# Patient Record
Sex: Female | Born: 1970
Health system: Southern US, Community
[De-identification: ages and names within clinical notes are randomized; demographics above are authoritative.]

## PROBLEM LIST (undated history)

## (undated) DIAGNOSIS — E785 Hyperlipidemia, unspecified: Secondary | ICD-10-CM

## (undated) HISTORY — DX: Hyperlipidemia, unspecified: E78.5

---

## 2000-01-28 ENCOUNTER — Other Ambulatory Visit: Admission: RE | Admit: 2000-01-28 | Discharge: 2000-01-28 | Payer: Self-pay | Admitting: Family Medicine

## 2005-11-25 ENCOUNTER — Other Ambulatory Visit: Admission: RE | Admit: 2005-11-25 | Discharge: 2005-11-25 | Payer: Self-pay | Admitting: *Deleted

## 2007-03-28 ENCOUNTER — Other Ambulatory Visit: Admission: RE | Admit: 2007-03-28 | Discharge: 2007-03-28 | Payer: Self-pay | Admitting: Family Medicine

## 2008-05-26 ENCOUNTER — Other Ambulatory Visit: Admission: RE | Admit: 2008-05-26 | Discharge: 2008-05-26 | Payer: Self-pay | Admitting: *Deleted

## 2008-05-26 ENCOUNTER — Other Ambulatory Visit: Admission: RE | Admit: 2008-05-26 | Discharge: 2008-05-26 | Payer: Self-pay | Admitting: Family Medicine

## 2010-06-24 ENCOUNTER — Other Ambulatory Visit (HOSPITAL_COMMUNITY)
Admission: RE | Admit: 2010-06-24 | Discharge: 2010-06-24 | Disposition: A | Discharge status: Home / Self Care | Payer: 59 | Source: Ambulatory Visit | Attending: Family Medicine | Admitting: Family Medicine

## 2010-06-24 ENCOUNTER — Other Ambulatory Visit: Payer: Self-pay | Admitting: Family Medicine

## 2010-06-24 DIAGNOSIS — Z124 Encounter for screening for malignant neoplasm of cervix: Secondary | ICD-10-CM | POA: Insufficient documentation

## 2011-07-06 ENCOUNTER — Other Ambulatory Visit (HOSPITAL_COMMUNITY)
Admission: RE | Admit: 2011-07-06 | Discharge: 2011-07-06 | Disposition: A | Discharge status: Home / Self Care | Payer: 59 | Source: Ambulatory Visit | Attending: Family Medicine | Admitting: Family Medicine

## 2011-07-06 ENCOUNTER — Other Ambulatory Visit: Payer: Self-pay | Admitting: Family Medicine

## 2011-07-06 DIAGNOSIS — Z124 Encounter for screening for malignant neoplasm of cervix: Secondary | ICD-10-CM | POA: Insufficient documentation

## 2011-07-07 ENCOUNTER — Other Ambulatory Visit: Payer: Self-pay | Admitting: Family Medicine

## 2011-07-07 DIAGNOSIS — N852 Hypertrophy of uterus: Secondary | ICD-10-CM

## 2011-07-27 ENCOUNTER — Ambulatory Visit
Admission: RE | Admit: 2011-07-27 | Discharge: 2011-07-27 | Disposition: A | Discharge status: Home / Self Care | Payer: 59 | Source: Ambulatory Visit | Attending: Family Medicine | Admitting: Family Medicine

## 2011-07-27 ENCOUNTER — Other Ambulatory Visit: Payer: 59

## 2011-07-27 DIAGNOSIS — N852 Hypertrophy of uterus: Secondary | ICD-10-CM

## 2011-08-22 ENCOUNTER — Other Ambulatory Visit (HOSPITAL_COMMUNITY)
Admission: RE | Admit: 2011-08-22 | Discharge: 2011-08-22 | Disposition: A | Discharge status: Home / Self Care | Payer: 59 | Source: Ambulatory Visit | Attending: Obstetrics and Gynecology | Admitting: Obstetrics and Gynecology

## 2011-08-22 ENCOUNTER — Other Ambulatory Visit: Payer: Self-pay | Admitting: Obstetrics and Gynecology

## 2011-08-22 DIAGNOSIS — Z1151 Encounter for screening for human papillomavirus (HPV): Secondary | ICD-10-CM | POA: Insufficient documentation

## 2011-10-19 ENCOUNTER — Other Ambulatory Visit: Payer: Self-pay | Admitting: Endocrinology

## 2011-10-19 DIAGNOSIS — E229 Hyperfunction of pituitary gland, unspecified: Secondary | ICD-10-CM

## 2011-11-07 ENCOUNTER — Other Ambulatory Visit: Payer: 59

## 2014-12-29 ENCOUNTER — Other Ambulatory Visit (HOSPITAL_COMMUNITY)
Admission: RE | Admit: 2014-12-29 | Discharge: 2014-12-29 | Disposition: A | Discharge status: Home / Self Care | Payer: 59 | Source: Ambulatory Visit | Attending: Obstetrics and Gynecology | Admitting: Obstetrics and Gynecology

## 2014-12-29 ENCOUNTER — Other Ambulatory Visit: Payer: Self-pay | Admitting: Obstetrics and Gynecology

## 2014-12-29 DIAGNOSIS — Z1151 Encounter for screening for human papillomavirus (HPV): Secondary | ICD-10-CM | POA: Insufficient documentation

## 2014-12-29 DIAGNOSIS — Z01419 Encounter for gynecological examination (general) (routine) without abnormal findings: Secondary | ICD-10-CM | POA: Insufficient documentation

## 2014-12-31 LAB — CYTOLOGY - PAP

## 2015-01-12 ENCOUNTER — Ambulatory Visit (INDEPENDENT_AMBULATORY_CARE_PROVIDER_SITE_OTHER): Payer: 59 | Admitting: Endocrinology

## 2015-01-12 ENCOUNTER — Encounter: Payer: Self-pay | Admitting: Endocrinology

## 2015-01-12 VITALS — BP 130/86 | HR 69 | Temp 98.3°F | Resp 14 | Ht 68.4 in | Wt 156.6 lb

## 2015-01-12 DIAGNOSIS — R635 Abnormal weight gain: Secondary | ICD-10-CM | POA: Diagnosis not present

## 2015-01-12 DIAGNOSIS — E221 Hyperprolactinemia: Secondary | ICD-10-CM

## 2015-01-12 LAB — BASIC METABOLIC PANEL
BUN: 13 mg/dL (ref 6–23)
CALCIUM: 9.8 mg/dL (ref 8.4–10.5)
CHLORIDE: 103 meq/L (ref 96–112)
CO2: 31 meq/L (ref 19–32)
CREATININE: 0.7 mg/dL (ref 0.40–1.20)
GFR: 116.97 mL/min (ref >60.00)
GLUCOSE: 94 mg/dL (ref 70–99)
Potassium: 3.9 mEq/L (ref 3.5–5.1)
Sodium: 139 mEq/L (ref 135–145)

## 2015-01-12 LAB — T4, FREE: Free T4: 0.57 ng/dL — ABNORMAL LOW (ref 0.60–1.60)

## 2015-01-12 NOTE — Progress Notes (Signed)
Patient ID: Michele Ortiz, female   DOB: [birth year], 44 y.o.   MRN: [id number]           Chief complaint: No menstrual cycles since 2013   History of Present Illness:  The patient had been seen about 3 years ago when she was having progressive decline in her menstrual bleeding and eventually stopped having menstrual cycles.  At that time her prolactin level was 109 She did not follow-up or get her MRI scan because of lack of insurance  When she saw her new gynecologist in 11/2014 she was still not having any menstrual cycles Does not complain of any milky discharge from the breasts. She has not had any previous pregnancies except when she was around 36 and this was terminated Also does not have any hot flashes or headaches Recent prolactin level was 72.5  She is now referred here for further management   Past Medical History  Diagnosis Date  . Hyperlipidemia     LDL 176 in 10/16, started atorvastatin    History reviewed. No pertinent past surgical history.  Family History  Problem Relation Age of Onset  . Hyperlipidemia Mother   . Diabetes Maternal Grandmother     Social History:  reports that she has never smoked. She has never used smokeless tobacco. Her alcohol and drug histories are not on file.  Allergies: No Known Allergies    Medication List       This list is accurate as of: 01/12/15  9:18 PM.  Always use your most recent med list.               atorvastatin 20 MG tablet  Commonly known as:  LIPITOR  Take 20 mg by mouth daily.     Vitamin D (Ergocalciferol) 50000 UNITS Caps capsule  Commonly known as:  DRISDOL  TAKE ONE CAPSULE BY MOUTH WEEKLY FOR 8 WEEKS        LABS:  Office Visit on 01/12/2015  Component Date Value Ref Range Status  . Sodium 01/12/2015 139  135 - 145 mEq/L Final  . Potassium 01/12/2015 3.9  3.5 - 5.1 mEq/L Final  . Chloride 01/12/2015 103  96 - 112 mEq/L Final  . CO2 01/12/2015 31  19 - 32 mEq/L Final  . Glucose, Bld  01/12/2015 94  70 - 99 mg/dL Final  . BUN 01/12/2015 13  6 - 23 mg/dL Final  . Creatinine, Ser 01/12/2015 0.70  0.40 - 1.20 mg/dL Final  . Calcium 01/12/2015 9.8  8.4 - 10.5 mg/dL Final  . GFR 01/12/2015 116.97  >60.00 mL/min Final  . Free T4 01/12/2015 0.57* 0.60 - 1.60 ng/dL Final     REVIEW OF SYSTEMS:        Review of Systems  Constitutional: Positive for weight gain. Negative for malaise.       Has gained 5-10 pounds over the last couple of years  HENT: Negative for headaches.   Eyes: Negative for blurred vision.       Peripheral vision is reported normal  Gastrointestinal: Negative for nausea.  Endocrine: Positive for menstrual changes and abnormal weight gain. Negative for fatigue, cold intolerance, heat intolerance and breast discharge.       Amenorrhea as discussed in history of present illness  Genitourinary: Negative for nocturia.  Neurological: Negative for weakness.  Psychiatric/Behavioral: Negative for insomnia.   ?  Diabetes: Her A1c was 6.0 in 10/16 from PCP, normal <5.6    PHYSICAL EXAM:  BP 130/86 mmHg  Pulse  69  Temp(Src) 98.3 F (36.8 C)  Resp 14  Ht 5' 8.4" (1.737 m)  Wt 156 lb 9.6 oz (71.033 kg)  BMI 23.54 kg/m2  SpO2 99%  GENERAL: Averagely built and nourished  No pallor, clubbing, lymphadenopathy or edema.   Skin:  no rash or pigmentation.  EYES:  Externally normal.  Fundii:  normal discs and vessels. Visual fields normal by confrontation  ENT: Oral mucosa and tongue normal.  THYROID:  Not palpable.  HEART:  Normal  S1 and S2; no murmur or click.  CHEST:  Normal shape.  Lungs: Vescicular breath sounds heard equally.  No crepitations/ wheeze.  ABDOMEN:  No distention.  Liver and spleen not palpable.  No other mass or tenderness.  NEUROLOGICAL: .Reflexes are bilaterally normal at biceps  JOINTS:  Normal.  ASSESSMENT:   Amenorrhea and hyperprolactinemia, likely to be from a prolactinoma This needs to be confirmed with MRI of  pituitary gland Discussed pituitary gland function and general information on prolactinoma discussed and bring card given Discussed potential treatments with medications such as cabergoline and duration of treatment being indeterminate at this time   PLAN:   MRI of pituitary to be scheduled  To discuss treatment plans once results are available Most likely will need  follow-up in about 2 months with repeat prolactin after treatment is started  [facility] 01/12/2015, 9:18 PM    addendum: free T4 is low, will add TSH also,  Most likely needs  To start levothyroxine

## 2015-01-13 ENCOUNTER — Other Ambulatory Visit (INDEPENDENT_AMBULATORY_CARE_PROVIDER_SITE_OTHER): Payer: 59

## 2015-01-13 DIAGNOSIS — I519 Heart disease, unspecified: Principal | ICD-10-CM

## 2015-01-13 DIAGNOSIS — E039 Hypothyroidism, unspecified: Secondary | ICD-10-CM

## 2015-01-13 DIAGNOSIS — E221 Hyperprolactinemia: Secondary | ICD-10-CM | POA: Insufficient documentation

## 2015-01-13 LAB — TSH: TSH: 1.16 u[IU]/mL (ref 0.35–4.50)

## 2015-01-27 ENCOUNTER — Telehealth: Payer: Self-pay | Admitting: Endocrinology

## 2015-01-27 NOTE — Telephone Encounter (Signed)
Pt's insurance will not cover [facility], please refer to [facility] or [facility]

## 2015-01-27 NOTE — Telephone Encounter (Signed)
Patient stated that she no longer needed the referral for imaging scan.

## 2015-01-27 NOTE — Telephone Encounter (Signed)
Please explain what as needed

## 2015-01-27 NOTE — Telephone Encounter (Signed)
FYI  Please see below

## 2015-01-28 NOTE — Telephone Encounter (Signed)
Nothing is needed, she was just advising you that she does not need the referral.

## 2015-01-30 ENCOUNTER — Other Ambulatory Visit: Payer: 59

## 2015-01-30 ENCOUNTER — Ambulatory Visit
Admission: RE | Admit: 2015-01-30 | Discharge: 2015-01-30 | Disposition: A | Discharge status: Home / Self Care | Payer: 59 | Source: Ambulatory Visit | Attending: Endocrinology | Admitting: Endocrinology

## 2015-01-30 DIAGNOSIS — E221 Hyperprolactinemia: Secondary | ICD-10-CM

## 2015-01-30 MED ORDER — GADOBENATE DIMEGLUMINE 529 MG/ML IV SOLN
8.0000 mL | Freq: Once | INTRAVENOUS | Status: AC | PRN
  Administered 2015-01-30: 8 mL via INTRAVENOUS

## 2015-02-02 ENCOUNTER — Other Ambulatory Visit: Payer: Self-pay | Admitting: *Deleted

## 2015-02-02 DIAGNOSIS — D352 Benign neoplasm of pituitary gland: Secondary | ICD-10-CM

## 2015-02-02 MED ORDER — CABERGOLINE 0.5 MG PO TABS: 0.2500 mg | ORAL_TABLET | ORAL | Status: DC

## 2015-03-25 ENCOUNTER — Other Ambulatory Visit (INDEPENDENT_AMBULATORY_CARE_PROVIDER_SITE_OTHER): Payer: 59

## 2015-03-25 DIAGNOSIS — D352 Benign neoplasm of pituitary gland: Secondary | ICD-10-CM

## 2015-03-25 LAB — T4, FREE: FREE T4: 0.86 ng/dL (ref 0.60–1.60)

## 2015-03-26 LAB — PROLACTIN: PROLACTIN: 4.7 ng/mL — AB (ref 4.8–23.3)

## 2015-03-30 ENCOUNTER — Ambulatory Visit (INDEPENDENT_AMBULATORY_CARE_PROVIDER_SITE_OTHER): Payer: 59 | Admitting: Endocrinology

## 2015-03-30 ENCOUNTER — Encounter: Payer: Self-pay | Admitting: Endocrinology

## 2015-03-30 VITALS — BP 116/72 | HR 69 | Temp 98.1°F | Resp 14 | Ht 68.0 in | Wt 153.8 lb

## 2015-03-30 DIAGNOSIS — E221 Hyperprolactinemia: Secondary | ICD-10-CM

## 2015-03-30 NOTE — Progress Notes (Signed)
Patient ID: Michele Ortiz, female   DOB: 1970/05/13, 45 y.o.   MRN: [id number]           Chief complaint:  Follow-up of her prolactinoma  History of Present Illness:  The patient had been seen about 3 years ago when she was having progressive decline in her menstrual bleeding and eventually stopped having menstrual cycles.  At that time her prolactin level was 109 She did not follow-up or get her MRI scan because of lack of insurance When she saw her new gynecologist in 11/2014 she was still not having any menstrual cycles , no history of any milky discharge from the breasts. No headaches She has not had any previous pregnancies except when she was around 20 and this was terminated    Baseline prolactin level was 72.5  MRI showed potential 7 mm microadenoma on the right   She was started on CABERGOLINE 0.5 mg, half tablet twice a week in 12/ 16 With this she has had regular menstrual cycles starting late December   She otherwise feels well also  Lab on 03/25/2015  Component Date Value Ref Range Status  . Prolactin 03/25/2015 4.7* 4.8 - 23.3 ng/mL Final  . Free T4 03/25/2015 0.86  0.60 - 1.60 ng/dL Final        Past Medical History  Diagnosis Date  . Hyperlipidemia     LDL 176 in 10/16, started atorvastatin    No past surgical history on file.  Family History  Problem Relation Age of Onset  . Hyperlipidemia Mother   . Diabetes Maternal Grandmother     Social History:  reports that she has never smoked. She has never used smokeless tobacco. Her alcohol and drug histories are not on file.  Allergies: No Known Allergies    Medication List       This list is accurate as of: 03/30/15  9:20 AM.  Always use your most recent med list.               atorvastatin 20 MG tablet  Commonly known as:  LIPITOR  Take 20 mg by mouth daily.     cabergoline 0.5 MG tablet  Commonly known as:  DOSTINEX  Take 0.5 tablets (0.25 mg total) by mouth 2 (two) times a week.     Vitamin D (Ergocalciferol) 50000 units Caps capsule  Commonly known as:  DRISDOL  Reported on 03/30/2015        LABS:  Lab on 03/25/2015  Component Date Value Ref Range Status  . Prolactin 03/25/2015 4.7* 4.8 - 23.3 ng/mL Final  . Free T4 03/25/2015 0.86  0.60 - 1.60 ng/dL Final       Review of Systems  ?  Diabetes: Her A1c was 6.0 in 10/16 from PCP, normal <5.6    PHYSICAL EXAM:  BP 116/72 mmHg  Pulse 69  Temp(Src) 98.1 F (36.7 C)  Resp 14  Ht 5\' 8"  (1.727 m)  Wt 153 lb 12.8 oz (69.763 kg)  BMI 23.39 kg/m2  SpO2 98%   ASSESSMENT:   Amenorrhea and hyperprolactinemia  from a 7 mm prolactinoma She is back to having menstrual cycles regularly now with 0.25 mg cabergoline twice a week Prolactin level is now just below normal indicating excellent response  Also her free T4 is back to normal without any supplementation possibly indicating some pressure effect on thyrotropin     PLAN:    Trial of cabergoline once a week, half tablet Discussed that she is now ovulating  and would need to prevent regnancy if needed, currently she is not planning any pregnancy Follow-up in 3 months    Sholonda Jobst 03/30/2015, 9:20 AM

## 2015-06-22 ENCOUNTER — Other Ambulatory Visit: Payer: 59

## 2015-06-22 DIAGNOSIS — E221 Hyperprolactinemia: Secondary | ICD-10-CM

## 2015-06-23 LAB — PROLACTIN: Prolactin: 72.2 ng/mL — ABNORMAL HIGH (ref 4.8–23.3)

## 2015-06-26 ENCOUNTER — Ambulatory Visit (INDEPENDENT_AMBULATORY_CARE_PROVIDER_SITE_OTHER): Payer: 59 | Admitting: Endocrinology

## 2015-06-26 ENCOUNTER — Encounter: Payer: Self-pay | Admitting: Endocrinology

## 2015-06-26 VITALS — BP 102/76 | HR 73 | Temp 98.2°F | Resp 14 | Ht 68.0 in | Wt 153.8 lb

## 2015-06-26 DIAGNOSIS — D352 Benign neoplasm of pituitary gland: Secondary | ICD-10-CM

## 2015-06-26 NOTE — Progress Notes (Signed)
Patient ID: Michele Ortiz, female   DOB: 1971-01-24, 45 y.o.   MRN: [id number]           Chief complaint:  Follow-up of prolactinoma  History of Present Illness:  The patient had been seen in 2013 when she was having progressive decline in her menstrual bleeding and eventually stopped having menstrual cycles.  At that time her prolactin level was 109 She did not follow-up or get her MRI scan because of lack of insurance When she saw her new gynecologist in 11/2014 she was still not having any menstrual cycles, no history of any milky discharge from the breasts.  She has not had any previous pregnancies except when she was around 20 and this was terminated    Baseline prolactin level was 72.5  MRI showed potential 7 mm microadenoma on the right   She was started on CABERGOLINE 0.5 mg, half tablet twice a week in 12/ 16 With this she has had regular menstrual cycles starting late December Since her prolactin was down to 4.7 she was told to take medication once a week but she misunderstood and stopped it completely  However her menstrual cycles are coming monthly still and she has no milk discharged from the breasts No headaches   Lab on 06/22/2015  Component Date Value Ref Range Status  . Prolactin 06/22/2015 72.2* 4.8 - 23.3 ng/mL Final        Past Medical History  Diagnosis Date  . Hyperlipidemia     LDL 176 in 10/16, started atorvastatin    No past surgical history on file.  Family History  Problem Relation Age of Onset  . Hyperlipidemia Mother   . Diabetes Maternal Grandmother     Social History:  reports that she has never smoked. She has never used smokeless tobacco. Her alcohol and drug histories are not on file.  Allergies: No Known Allergies    Medication List       This list is accurate as of: 06/26/15  9:14 AM.  Always use your most recent med list.               atorvastatin 20 MG tablet  Commonly known as:  LIPITOR  Take 20 mg by mouth  daily.        LABS:  Lab on 06/22/2015  Component Date Value Ref Range Status  . Prolactin 06/22/2015 72.2* 4.8 - 23.3 ng/mL Final       Review of Systems  ?  Diabetes: Her A1c was 6.0 in 10/16 from PCP, normal <5.6    PHYSICAL EXAM:  BP 102/76 mmHg  Pulse 73  Temp(Src) 98.2 F (36.8 C)  Resp 14  Ht 5\' 8"  (1.727 m)  Wt 153 lb 12.8 oz (69.763 kg)  BMI 23.39 kg/m2  SpO2 98%   ASSESSMENT:   Amenorrhea and hyperprolactinemia  from a 7 mm prolactinoma She stopped her cabergoline by mistake and her prolactin is back to baseline level of 72  Reminded the patient that she needs to be on medication for at least 6 months with normal levels before trying to taper off her medication     PLAN:    She will go back to cabergoline twice a week, half tablet. She tries to take this on Sundays in the evenings because it tends to make her dizzy with the first dose She will have labs done in about a month and follow-up in 4 months    Zian Delair 06/26/2015, 9:14 AM

## 2015-06-26 NOTE — Patient Instructions (Addendum)
Start Rx 2x per week again  Labs 1 month

## 2015-07-28 ENCOUNTER — Other Ambulatory Visit: Payer: 59

## 2015-07-28 DIAGNOSIS — D352 Benign neoplasm of pituitary gland: Secondary | ICD-10-CM

## 2015-07-29 LAB — PROLACTIN: PROLACTIN: 31.5 ng/mL — AB (ref 4.8–23.3)

## 2015-08-02 NOTE — Progress Notes (Signed)
Quick Note:  Please let patient know that the prolactin level is still high. She will continue cabergoline twice a week but on first dose she will take the full tablet and the second dose of half tablet Need to change her appointment to 8 weeks with repeat prolactin from [facility] ______

## 2015-08-04 ENCOUNTER — Other Ambulatory Visit: Payer: Self-pay | Admitting: *Deleted

## 2015-08-04 MED ORDER — CABERGOLINE 0.5 MG PO TABS: ORAL_TABLET | ORAL | Status: DC

## 2015-09-16 ENCOUNTER — Other Ambulatory Visit: Payer: Self-pay | Admitting: Endocrinology

## 2015-09-16 DIAGNOSIS — D352 Benign neoplasm of pituitary gland: Secondary | ICD-10-CM

## 2015-09-18 ENCOUNTER — Other Ambulatory Visit: Payer: 59

## 2015-09-18 DIAGNOSIS — D352 Benign neoplasm of pituitary gland: Secondary | ICD-10-CM

## 2015-09-19 LAB — PROLACTIN: PROLACTIN: 9.7 ng/mL (ref 4.8–23.3)

## 2015-09-21 ENCOUNTER — Ambulatory Visit (INDEPENDENT_AMBULATORY_CARE_PROVIDER_SITE_OTHER): Payer: 59 | Admitting: Endocrinology

## 2015-09-21 ENCOUNTER — Telehealth: Payer: Self-pay | Admitting: Endocrinology

## 2015-09-21 ENCOUNTER — Telehealth: Payer: Self-pay

## 2015-09-21 ENCOUNTER — Encounter: Payer: Self-pay | Admitting: Endocrinology

## 2015-09-21 VITALS — BP 110/72 | HR 72 | Wt 154.0 lb

## 2015-09-21 DIAGNOSIS — D352 Benign neoplasm of pituitary gland: Secondary | ICD-10-CM

## 2015-09-21 NOTE — Progress Notes (Signed)
Patient ID: Michele Ortiz, female   DOB: 01-28-1971, 45 y.o.   MRN: [id number]           Chief complaint:  Follow-up of prolactinoma  History of Present Illness:  The patient had been seen in 2013 when she was having progressive decline in her menstrual bleeding and eventually stopped having menstrual cycles.  At that time her prolactin level was 109 She did not follow-up or get her MRI scan because of lack of insurance When she saw her new gynecologist in 11/2014 she was still not having any menstrual cycles, no history of any milky discharge from the breasts.  She has not had any previous pregnancies except when she was around 20 and this was terminated    Baseline prolactin level was 72.5  MRI showed potential 7 mm microadenoma on the right   She was started on CABERGOLINE 0.5 mg, half tablet twice a week in 12/ 16 With this she has had regular menstrual cycles starting late December Since her prolactin was down to 4.7 she was told to take medication once a week but she misunderstood and stopped it completely  With restarting her cabergoline again in 05/2015 her prolactin is back to normal again  Recently her menstrual cycles are coming monthly  and she has no milk discharged from the breasts No headaches She does get a little dizzy the day after taking her Dostinex but this is tolerable.  Taking this on Tuesdays and Fridays now   Lab on 09/18/2015  Component Date Value Ref Range Status  . Prolactin 09/19/2015 9.7  4.8 - 23.3 ng/mL Final        Past Medical History:  Diagnosis Date  . Hyperlipidemia    LDL 176 in 10/16, started atorvastatin    No past surgical history on file.  Family History  Problem Relation Age of Onset  . Hyperlipidemia Mother   . Diabetes Maternal Grandmother     Social History:  reports that she has never smoked. She has never used smokeless tobacco. Her alcohol and drug histories are not on file.  Allergies: No Known Allergies      Medication List       Accurate as of 09/21/15  9:02 AM. Always use your most recent med list.          atorvastatin 20 MG tablet Commonly known as:  LIPITOR Take 20 mg by mouth daily.   cabergoline 0.5 MG tablet Commonly known as:  DOSTINEX Take 1 tablet the first of the week, and then 1/2 tablet the second time of the week       LABS:  Lab on 09/18/2015  Component Date Value Ref Range Status  . Prolactin 09/19/2015 9.7  4.8 - 23.3 ng/mL Final       Review of Systems     PHYSICAL EXAM:  BP 110/72 (BP Location: Left Arm, Patient Position: Sitting)   Pulse 72   Wt 154 lb (69.9 kg)   LMP 08/29/2015   SpO2 93%   BMI 23.42 kg/m    ASSESSMENT:   Amenorrhea and hyperprolactinemia  from a 7 mm prolactinoma She was not able to stop her cabergoline earlier this year with prolactin going back up to 72 Currently she is asymptomatic in her prolactin is excellent at 9.7 She is not intending to have any pregnancies now  Reminded the patient that she needs to be on medication for at least 6 months with normal levels before trying to taper off her medication  PLAN:    Continue cabergoline twice a week, half tablet. She can try taking the medication earlier in the day rather than at bedtime to avoid dizziness the next day  She will have labs done in about a month and follow-up in 6 months    Evalene Vath 09/21/2015, 9:02 AM

## 2015-09-21 NOTE — Telephone Encounter (Signed)
Patient came and seen you today for an appointment. She has two appointments set up, I was curious if you still wanted to see the patient for her 10/28/15 appointment and the 03/23/16 appointment. Please advise she is asking. Thank you!

## 2015-09-21 NOTE — Telephone Encounter (Signed)
Patient would like to know if she needs her 10/28/15 appointment, since she came in to see the doctor today.

## 2015-09-21 NOTE — Patient Instructions (Signed)
Same dose 

## 2015-09-21 NOTE — Telephone Encounter (Signed)
She is going to come back in 6 months only

## 2015-09-21 NOTE — Telephone Encounter (Signed)
Called and left patient a message to notify her that we did not need to see her for the 09/2015 appointment, we needed to see her in 6 months for her 02/2016 appointment. Checked with MD to clarify.

## 2015-10-28 ENCOUNTER — Ambulatory Visit: Payer: 59 | Admitting: Endocrinology

## 2016-03-10 ENCOUNTER — Other Ambulatory Visit: Payer: Self-pay | Admitting: Endocrinology

## 2016-03-18 ENCOUNTER — Other Ambulatory Visit: Payer: Self-pay

## 2016-03-18 DIAGNOSIS — D352 Benign neoplasm of pituitary gland: Secondary | ICD-10-CM

## 2016-03-19 LAB — PROLACTIN: PROLACTIN: 4 ng/mL — AB (ref 4.8–23.3)

## 2016-03-23 ENCOUNTER — Ambulatory Visit: Payer: 59 | Admitting: Endocrinology

## 2016-04-03 ENCOUNTER — Emergency Department (HOSPITAL_COMMUNITY)
Admission: EM | Admit: 2016-04-03 | Discharge: 2016-04-03 | Disposition: A | Discharge status: Home / Self Care | Payer: Self-pay | Attending: Emergency Medicine | Admitting: Emergency Medicine

## 2016-04-03 ENCOUNTER — Encounter (HOSPITAL_COMMUNITY): Payer: Self-pay | Admitting: *Deleted

## 2016-04-03 DIAGNOSIS — R1031 Right lower quadrant pain: Secondary | ICD-10-CM | POA: Insufficient documentation

## 2016-04-03 DIAGNOSIS — Z79899 Other long term (current) drug therapy: Secondary | ICD-10-CM | POA: Insufficient documentation

## 2016-04-03 LAB — COMPREHENSIVE METABOLIC PANEL
ALBUMIN: 4.3 g/dL (ref 3.5–5.0)
ALT: 16 U/L (ref 14–54)
ANION GAP: 11 (ref 5–15)
AST: 20 U/L (ref 15–41)
Alkaline Phosphatase: 42 U/L (ref 38–126)
BUN: 12 mg/dL (ref 6–20)
CALCIUM: 9.2 mg/dL (ref 8.9–10.3)
CHLORIDE: 103 mmol/L (ref 101–111)
CO2: 23 mmol/L (ref 22–32)
Creatinine, Ser: 0.9 mg/dL (ref 0.44–1.00)
GFR calc non Af Amer: >60 mL/min (ref >60)
Glucose, Bld: 107 mg/dL — ABNORMAL HIGH (ref 65–99)
POTASSIUM: 3.5 mmol/L (ref 3.5–5.1)
SODIUM: 137 mmol/L (ref 135–145)
Total Bilirubin: 0.3 mg/dL (ref 0.3–1.2)
Total Protein: 7.5 g/dL (ref 6.5–8.1)

## 2016-04-03 LAB — CBC
HEMATOCRIT: 36.4 % (ref 36.0–46.0)
HEMOGLOBIN: 11.9 g/dL — AB (ref 12.0–15.0)
MCH: 28.7 pg (ref 26.0–34.0)
MCHC: 32.7 g/dL (ref 30.0–36.0)
MCV: 87.7 fL (ref 78.0–100.0)
Platelets: 252 10*3/uL (ref 150–400)
RBC: 4.15 MIL/uL (ref 3.87–5.11)
RDW: 13.3 % (ref 11.5–15.5)
WBC: 9.8 10*3/uL (ref 4.0–10.5)

## 2016-04-03 LAB — URINALYSIS, ROUTINE W REFLEX MICROSCOPIC
Bilirubin Urine: NEGATIVE
Glucose, UA: NEGATIVE mg/dL
HGB URINE DIPSTICK: NEGATIVE
Ketones, ur: NEGATIVE mg/dL
Leukocytes, UA: NEGATIVE
Nitrite: NEGATIVE
PH: 5 (ref 5.0–8.0)
Protein, ur: NEGATIVE mg/dL
SPECIFIC GRAVITY, URINE: 1.01 (ref 1.005–1.030)

## 2016-04-03 LAB — LIPASE, BLOOD: LIPASE: 14 U/L (ref 11–51)

## 2016-04-03 MED ORDER — ONDANSETRON 4 MG PO TBDP: ORAL_TABLET | ORAL | 0 refills | Status: DC

## 2016-04-03 NOTE — ED Triage Notes (Signed)
Pt reports RLQ pain that radiates around to her back, started yesterday. Denies n/v/d. Reports low grade fever. Denies urinary symptoms.

## 2016-04-03 NOTE — ED Provider Notes (Addendum)
[facility] DEPT Provider Note   CSN: [id number] Arrival date & time: 04/03/16  1153  By signing my name below, I, Arianna Nassar, attest that this documentation has been prepared under the direction and in the presence of Merrily Pew, MD.  Electronically Signed: Julien Nordmann, ED Scribe. 04/03/16. 12:42 PM.    History   Chief Complaint Chief Complaint  Patient presents with  . Abdominal Pain    The history is provided by the patient. No language interpreter was used.   HPI Comments: Michele Ortiz is a 46 y.o. female who has a PMHx of HLD presents to the Emergency Department complaining of RLQ abdominal pain that radiates to her right lower back that began yesterday. Pt describes her pain as a sharp sensation. She reports associated urinary frequency. She expresses increased pain when taking a deep breath. Pt was seen at [facility] earlier today for her symptoms and was referred to the ED to rule out appendicitis. She has not had similar symptoms in the past. Pt does not have any abdominal surgical hx nor does she have a hx of nephrolithiasis. She says she recently finished taking Lipitor and dostinex for her pituitary and is no longer taking any medications on a regular basis. She denies fever, nausea, vomiting, loss of appetite, diarrhea, blood in stool, dysuria, rash, leg swelling, cough, shortness of breath, and vaginal discharge.   Past Medical History:  Diagnosis Date  . Hyperlipidemia    LDL 176 in 10/16, started atorvastatin    Patient Active Problem List   Diagnosis Date Noted  . Prolactinoma, benign ([place]) 06/26/2015  . Hyperprolactinemia ([place]) 01/13/2015    History reviewed. No pertinent surgical history.  OB History    No data available       Home Medications    Prior to Admission medications   Medication Sig Start Date End Date Taking? Authorizing Provider  atorvastatin (LIPITOR) 20 MG tablet Take 20 mg by mouth daily. 01/05/15   Historical  Provider, MD  cabergoline (DOSTINEX) 0.5 MG tablet TAKE 1 TABLET THE FIRST OF THE WEEK, AND THEN 1/2 TABLET THE SECOND TIME OF THE WEEK 03/10/16   [name], MD  ondansetron (ZOFRAN ODT) 4 MG disintegrating tablet 4mg  ODT q4 hours prn nausea/vomit 04/03/16   Merrily Pew, MD    Family History Family History  Problem Relation Age of Onset  . Hyperlipidemia Mother   . Diabetes Maternal Grandmother     Social History Social History  Substance Use Topics  . Smoking status: Never Smoker  . Smokeless tobacco: Never Used  . Alcohol use Not on file     Allergies   Patient has no known allergies.   Review of Systems Review of Systems  Constitutional: Negative for appetite change and fever.  Respiratory: Negative for cough and shortness of breath.   Cardiovascular: Negative for chest pain and leg swelling.  Gastrointestinal: Positive for abdominal pain. Negative for blood in stool, diarrhea, nausea and vomiting.  Genitourinary: Positive for frequency. Negative for dysuria, urgency and vaginal discharge.  Musculoskeletal: Positive for back pain (right lower ).  Skin: Negative for rash.  All other systems reviewed and are negative.    Physical Exam Updated Vital Signs BP 128/70   Pulse 82   Temp 98.3 F (36.8 C) (Oral)   Resp 17   Ht 5\' 7"  (1.702 m)   Wt 164 lb (74.4 kg)   LMP 03/16/2016   SpO2 100%   BMI 25.69 kg/m   Physical Exam  Constitutional: She is oriented to person, place, and time. She appears well-developed and well-nourished.  HENT:  Head: Normocephalic.  Eyes: Conjunctivae and EOM are normal.  Neck: Normal range of motion.  Cardiovascular:  No murmur heard. Pulmonary/Chest: Effort normal. She has no wheezes.  Abdominal: Soft. She exhibits no distension. There is tenderness. There is rebound. There is no guarding.  RLQ tenderness, no mass, no guarding, mild rebound No rosvings sign  Musculoskeletal: Normal range of motion. She exhibits no tenderness.  No  paraspinal or midline thoracic and cervical tenderness, no flank tenderness  Neurological: She is alert and oriented to person, place, and time. Coordination normal.  Skin: Skin is warm and dry. No rash noted.  Psychiatric: She has a normal mood and affect.  Nursing note and vitals reviewed.    ED Treatments / Results  DIAGNOSTIC STUDIES: Oxygen Saturation is 100% on RA, normal by my interpretation.  COORDINATION OF CARE:  12:25 PM Discussed treatment plan with pt at bedside and pt agreed to plan.  Labs (all labs ordered are listed, but only abnormal results are displayed) Labs Reviewed  COMPREHENSIVE METABOLIC PANEL - Abnormal; Notable for the following:       Result Value   Glucose, Bld 107 (*)    All other components within normal limits  CBC - Abnormal; Notable for the following:    Hemoglobin 11.9 (*)    All other components within normal limits  LIPASE, BLOOD  URINALYSIS, ROUTINE W REFLEX MICROSCOPIC  I-STAT BETA HCG BLOOD, ED ([facility], [facility], [facility] ONLY)    EKG  EKG Interpretation None       Radiology No results found.  Procedures Procedures (including critical care time)  Medications Ordered in ED Medications - No data to display   Initial Impression / Assessment and Plan / ED Course  I have reviewed the triage vital signs and the nursing notes.  Pertinent labs & imaging results that were available during my care of the patient were reviewed by me and considered in my medical decision making (see chart for details).     RLQ pain of uncertain etiology. No urinary symptoms to make UTI likely. No other GI symptoms to suspect appendicitis (negative wbc, no fever, anorexia, nausea or vomiting). Considered GU causes with her history of prolactinoma however this started 24 hours ago and has been improving so think that is unlikely at this time as well, however could still be some type of ovarian cyst.  Discussed imaging (CT, [place]) for symptoms however after shared decision  making she decided she would wait for 24-48 hours and if no improvement then return for repeat eval. Would also return earlier if developed worsenign pain, fever, nausea, vomiting or other concerning symptoms.     Final Clinical Impressions(s) / ED Diagnoses   Final diagnoses:  Right lower quadrant abdominal pain    New Prescriptions Discharge Medication List as of 04/03/2016  2:24 PM    START taking these medications   Details  ondansetron (ZOFRAN ODT) 4 MG disintegrating tablet 4mg  ODT q4 hours prn nausea/vomit, Print      I personally performed the services described in this documentation, which was scribed in my presence. The recorded information has been reviewed and is accurate.    Merrily Pew, MD 04/03/16 1458    Merrily Pew, MD 04/03/16 [phone]

## 2016-04-04 ENCOUNTER — Emergency Department (HOSPITAL_COMMUNITY): Payer: Self-pay

## 2016-04-04 ENCOUNTER — Emergency Department (HOSPITAL_COMMUNITY)
Admission: EM | Admit: 2016-04-04 | Discharge: 2016-04-04 | Disposition: A | Discharge status: Home / Self Care | Payer: Self-pay | Attending: Emergency Medicine | Admitting: Emergency Medicine

## 2016-04-04 ENCOUNTER — Encounter (HOSPITAL_COMMUNITY): Payer: Self-pay | Admitting: Emergency Medicine

## 2016-04-04 DIAGNOSIS — D259 Leiomyoma of uterus, unspecified: Secondary | ICD-10-CM | POA: Insufficient documentation

## 2016-04-04 DIAGNOSIS — N1330 Unspecified hydronephrosis: Secondary | ICD-10-CM | POA: Insufficient documentation

## 2016-04-04 LAB — CBC WITH DIFFERENTIAL/PLATELET
BASOS ABS: 0 10*3/uL (ref 0.0–0.1)
BASOS PCT: 0 %
EOS ABS: 0 10*3/uL (ref 0.0–0.7)
EOS PCT: 0 %
HCT: 37.5 % (ref 36.0–46.0)
Hemoglobin: 12.3 g/dL (ref 12.0–15.0)
LYMPHS PCT: 15 %
Lymphs Abs: 1.5 10*3/uL (ref 0.7–4.0)
MCH: 29.1 pg (ref 26.0–34.0)
MCHC: 32.8 g/dL (ref 30.0–36.0)
MCV: 88.7 fL (ref 78.0–100.0)
Monocytes Absolute: 0.5 10*3/uL (ref 0.1–1.0)
Monocytes Relative: 5 %
Neutro Abs: 8.2 10*3/uL — ABNORMAL HIGH (ref 1.7–7.7)
Neutrophils Relative %: 80 %
PLATELETS: 258 10*3/uL (ref 150–400)
RBC: 4.23 MIL/uL (ref 3.87–5.11)
RDW: 13.3 % (ref 11.5–15.5)
WBC: 10.2 10*3/uL (ref 4.0–10.5)

## 2016-04-04 LAB — COMPREHENSIVE METABOLIC PANEL
ALBUMIN: 4.3 g/dL (ref 3.5–5.0)
ALT: 15 U/L (ref 14–54)
AST: 17 U/L (ref 15–41)
Alkaline Phosphatase: 44 U/L (ref 38–126)
Anion gap: 11 (ref 5–15)
BUN: 13 mg/dL (ref 6–20)
CHLORIDE: 99 mmol/L — AB (ref 101–111)
CO2: 22 mmol/L (ref 22–32)
CREATININE: 1.03 mg/dL — AB (ref 0.44–1.00)
Calcium: 8.8 mg/dL — ABNORMAL LOW (ref 8.9–10.3)
GFR calc Af Amer: >60 mL/min (ref >60)
GFR calc non Af Amer: >60 mL/min (ref >60)
GLUCOSE: 113 mg/dL — AB (ref 65–99)
Potassium: 3.3 mmol/L — ABNORMAL LOW (ref 3.5–5.1)
SODIUM: 132 mmol/L — AB (ref 135–145)
Total Bilirubin: 0.8 mg/dL (ref 0.3–1.2)
Total Protein: 7.9 g/dL (ref 6.5–8.1)

## 2016-04-04 LAB — I-STAT BETA HCG BLOOD, ED (MC, WL, AP ONLY)
I-stat hCG, quantitative: <5 m[IU]/mL
I-stat hCG, quantitative: <5 m[IU]/mL

## 2016-04-04 LAB — LIPASE, BLOOD: Lipase: 23 U/L (ref 11–51)

## 2016-04-04 MED ORDER — HYDROCODONE-ACETAMINOPHEN 5-325 MG PO TABS: 1.0000 | ORAL_TABLET | Freq: Four times a day (QID) | ORAL | 0 refills | Status: DC | PRN

## 2016-04-04 MED ORDER — FENTANYL CITRATE (PF) 100 MCG/2ML IJ SOLN
50.0000 ug | Freq: Once | INTRAMUSCULAR | Status: AC
  Administered 2016-04-04: 50 ug via INTRAVENOUS
  Filled 2016-04-04: qty 2

## 2016-04-04 MED ORDER — DOCUSATE SODIUM 100 MG PO CAPS: 100.0000 mg | ORAL_CAPSULE | Freq: Two times a day (BID) | ORAL | 0 refills | Status: DC

## 2016-04-04 MED ORDER — IOPAMIDOL (ISOVUE-300) INJECTION 61%
INTRAVENOUS | Status: AC
  Administered 2016-04-04: 100 mL
  Filled 2016-04-04: qty 100

## 2016-04-04 MED ORDER — HYDROMORPHONE HCL 2 MG/ML IJ SOLN
1.0000 mg | Freq: Once | INTRAMUSCULAR | Status: AC
  Administered 2016-04-04: 1 mg via INTRAVENOUS
  Filled 2016-04-04: qty 1

## 2016-04-04 MED ORDER — SODIUM CHLORIDE 0.9 % IV BOLUS (SEPSIS)
1000.0000 mL | Freq: Once | INTRAVENOUS | Status: AC
  Administered 2016-04-04: 1000 mL via INTRAVENOUS

## 2016-04-04 MED ORDER — ONDANSETRON HCL 4 MG/2ML IJ SOLN
4.0000 mg | Freq: Once | INTRAMUSCULAR | Status: AC
  Administered 2016-04-04: 4 mg via INTRAVENOUS
  Filled 2016-04-04: qty 2

## 2016-04-04 NOTE — ED Provider Notes (Signed)
[facility] DEPT Provider Note   CSN: [id number] Arrival date & time: 04/04/16  1656     History   Chief Complaint Chief Complaint  Patient presents with  . Abdominal Pain    HPI Michele Ortiz is a 46 y.o. female.   Abdominal Pain   This is a new problem. The current episode started 2 days ago. The problem occurs daily. The pain is located in the RLQ. The quality of the pain is aching. The pain is mild. Associated symptoms include nausea and vomiting.    Past Medical History:  Diagnosis Date  . Hyperlipidemia    LDL 176 in 10/16, started atorvastatin    Patient Active Problem List   Diagnosis Date Noted  . Prolactinoma, benign ([place]) 06/26/2015  . Hyperprolactinemia ([place]) 01/13/2015    History reviewed. No pertinent surgical history.  OB History    No data available       Home Medications    Prior to Admission medications   Medication Sig Start Date End Date Taking? Authorizing Provider  atorvastatin (LIPITOR) 20 MG tablet Take 20 mg by mouth daily. 01/05/15   Historical Provider, MD  cabergoline (DOSTINEX) 0.5 MG tablet TAKE 1 TABLET THE FIRST OF THE WEEK, AND THEN 1/2 TABLET THE SECOND TIME OF THE WEEK 03/10/16   [name], MD  docusate sodium (COLACE) 100 MG capsule Take 1 capsule (100 mg total) by mouth every 12 (twelve) hours. 04/04/16   Merrily Pew, MD  HYDROcodone-acetaminophen (NORCO/VICODIN) 5-325 MG tablet Take 1-2 tablets by mouth every 6 (six) hours as needed for severe pain. 04/04/16   Merrily Pew, MD  ondansetron (ZOFRAN ODT) 4 MG disintegrating tablet 4mg  ODT q4 hours prn nausea/vomit 04/03/16   Merrily Pew, MD    Family History Family History  Problem Relation Age of Onset  . Hyperlipidemia Mother   . Diabetes Maternal Grandmother     Social History Social History  Substance Use Topics  . Smoking status: Never Smoker  . Smokeless tobacco: Never Used  . Alcohol use Not on file     Allergies   Patient has no known  allergies.   Review of Systems Review of Systems  Gastrointestinal: Positive for abdominal pain, nausea and vomiting.  All other systems reviewed and are negative.    Physical Exam Updated Vital Signs BP 112/60 (BP Location: Right Arm)   Pulse 76   Temp 98 F (36.7 C) (Oral)   Resp 16   Ht 5\' 7"  (1.702 m)   Wt 164 lb (74.4 kg)   LMP 03/16/2016   SpO2 100%   BMI 25.69 kg/m   Physical Exam  Constitutional: She is oriented to person, place, and time. She appears well-developed and well-nourished.  HENT:  Head: Normocephalic and atraumatic.  Eyes: Conjunctivae and EOM are normal.  Neck: Normal range of motion.  Cardiovascular: Normal rate and regular rhythm.   Pulmonary/Chest: Effort normal and breath sounds normal. No stridor. No respiratory distress.  Abdominal: Soft. Bowel sounds are normal. She exhibits no distension. There is tenderness (RLQ).  Musculoskeletal: Normal range of motion.  Neurological: She is alert and oriented to person, place, and time.  Skin: Skin is warm and dry.  Nursing note and vitals reviewed.    ED Treatments / Results  Labs (all labs ordered are listed, but only abnormal results are displayed) Labs Reviewed  CBC WITH DIFFERENTIAL/PLATELET - Abnormal; Notable for the following:       Result Value   Neutro Abs 8.2 (*)  All other components within normal limits  COMPREHENSIVE METABOLIC PANEL - Abnormal; Notable for the following:    Sodium 132 (*)    Potassium 3.3 (*)    Chloride 99 (*)    Glucose, Bld 113 (*)    Creatinine, Ser 1.03 (*)    Calcium 8.8 (*)    All other components within normal limits  LIPASE, BLOOD  URINALYSIS, ROUTINE W REFLEX MICROSCOPIC  I-STAT BETA HCG BLOOD, ED ([facility], [facility], [facility] ONLY)    EKG  EKG Interpretation None       Radiology Ct Abdomen Pelvis W Contrast  Result Date: 04/04/2016 CLINICAL DATA:  46 y/o F; right lower quadrant pain with emesis, evaluate for appendicitis. EXAM: CT ABDOMEN AND PELVIS WITH  CONTRAST TECHNIQUE: Multidetector CT imaging of the abdomen and pelvis was performed using the standard protocol following bolus administration of intravenous contrast. CONTRAST:  1 ISOVUE-300 IOPAMIDOL (ISOVUE-300) INJECTION 61% COMPARISON:  None. FINDINGS: Lower chest: No acute abnormality. Hepatobiliary: No focal liver abnormality is seen. No gallstones, gallbladder wall thickening, or biliary dilatation. Pancreas: Unremarkable. No pancreatic ductal dilatation or surrounding inflammatory changes. Spleen: Normal in size without focal abnormality. Adrenals/Urinary Tract: Moderate right-sided hydronephrosis, urothelial enhancement, delayed right kidney nephrogram, and right-sided retroperitoneal edema. No obstructing stone identified. Normal left kidney. Normal adrenal glands. Mildly distended bladder. Stomach/Bowel: Stomach is within normal limits. Appendix appears normal. No evidence of bowel wall thickening, distention, or inflammatory changes. Vascular/Lymphatic: No significant vascular findings are present. No enlarged abdominal or pelvic lymph nodes. Reproductive: Markedly enlarged myomatous uterus measuring up to 10.2 by 13.3 x 16.4 cm (AP x ML x CC series 5, image 34 and series 6, image 45). There may be mass effect on the right-sided ureter in the right pelvis by the enlarged uterus (series 2, image 70). Other: No abdominal wall hernia or abnormality. No abdominopelvic ascites. Musculoskeletal: No acute or significant osseous findings. IMPRESSION: 1. Moderate right-sided hydronephrosis and right-sided retroperitoneal edema. No obstructing stone identified. Possible mass effect on the distal ureter in the right hemipelvis by the markedly enlarged myomatous uterus. Urothelial enhancement and retroperitoneal edema may represent collecting system infection or be related to ureteral obstruction. 2. Normal appendix. Electronically Signed   By: Kristine Garbe M.D.   On: 04/04/2016 22:21     Procedures Procedures (including critical care time)  Medications Ordered in ED Medications  sodium chloride 0.9 % bolus 1,000 mL (0 mLs Intravenous Stopped 04/04/16 2210)  ondansetron (ZOFRAN) injection 4 mg (4 mg Intravenous Given 04/04/16 2041)  fentaNYL (SUBLIMAZE) injection 50 mcg (50 mcg Intravenous Given 04/04/16 2041)  iopamidol (ISOVUE-300) 61 % injection (100 mLs  Contrast Given 04/04/16 2148)  HYDROmorphone (DILAUDID) injection 1 mg (1 mg Intravenous Given 04/04/16 2253)     Initial Impression / Assessment and Plan / ED Course  I have reviewed the triage vital signs and the nursing notes.  Pertinent labs & imaging results that were available during my care of the patient were reviewed by me and considered in my medical decision making (see chart for details).     46 yo F here with hydronephrosis 2/2 fibroids. Will d/w gynecologist about possible intervention. I discussed with on-call urology who did not recommend follow up with them soon unless could not get fibroids taken care of. Plan for dc w/ gynecology follow up  Final Clinical Impressions(s) / ED Diagnoses   Final diagnoses:  Uterine leiomyoma, unspecified location  Hydronephrosis of right kidney    New Prescriptions Discharge Medication List as  of 04/04/2016 11:06 PM    START taking these medications   Details  docusate sodium (COLACE) 100 MG capsule Take 1 capsule (100 mg total) by mouth every 12 (twelve) hours., Starting Mon 04/04/2016, Print    HYDROcodone-acetaminophen (NORCO/VICODIN) 5-325 MG tablet Take 1-2 tablets by mouth every 6 (six) hours as needed for severe pain., Starting Mon 04/04/2016, Print         Merrily Pew, MD 04/05/16 [phone]

## 2016-04-04 NOTE — ED Triage Notes (Signed)
Seen yesterday  For  Rt abd pain  And given meds for nausea, has vomited x 1 more but did not take her meds still having rt  abd pain, denies dysuria, states she was told to come back for ct if no better

## 2016-04-06 ENCOUNTER — Ambulatory Visit (INDEPENDENT_AMBULATORY_CARE_PROVIDER_SITE_OTHER): Payer: Self-pay | Admitting: Endocrinology

## 2016-04-06 ENCOUNTER — Ambulatory Visit: Payer: Self-pay | Admitting: Endocrinology

## 2016-04-06 VITALS — BP 112/80 | HR 87 | Wt 163.0 lb

## 2016-04-06 DIAGNOSIS — D352 Benign neoplasm of pituitary gland: Secondary | ICD-10-CM

## 2016-04-06 MED ORDER — CABERGOLINE 0.5 MG PO TABS: 0.2500 mg | ORAL_TABLET | ORAL | 5 refills | Status: DC

## 2016-04-06 NOTE — Progress Notes (Signed)
Patient ID: Michele Ortiz, female   DOB: Jun 01, 1970, 46 y.o.   MRN: [id number]           Chief complaint:  Follow-up of prolactinoma  History of Present Illness:  The patient had been seen in 2013 when she was having progressive decline in her menstrual bleeding and eventually stopped having menstrual cycles.  At that time her prolactin level was 109 She did not follow-up or get her MRI scan because of lack of insurance When she saw her new gynecologist in 11/2014 she was still not having any menstrual cycles, no history of any milky discharge from the breasts.  She has not had any previous pregnancies except when she was around 20 and this was terminated  Baseline prolactin level was 72.5  MRI showed potential 7 mm microadenoma on the right   She was started on CABERGOLINE 0.5 mg, half tablet twice a week in 12/ 16 With this she has had regular menstrual cycles  Since her prolactin was down to 4.7 in 1/17 she was told to take medication once a week but she misunderstood and stopped it completely. This resulted in her prolactin going up back up to 72  She has subsequently been taking 1 tablet on Wednesdays and a half tablet on Sundays She takes her medication during the day at work and this causes less dizziness compared to bedtime doses  Recently her menstrual cycles are coming monthly  and she has no milk discharged from the breasts No headaches  Prolactin level was mildly decreased last month when she had her labs  Lab Results  Component Value Date   PROLACTIN 4.0 (L) 03/18/2016   PROLACTIN 9.7 09/18/2015   PROLACTIN 31.5 (H) 07/28/2015   PROLACTIN 72.2 (H) 06/22/2015   PROLACTIN 4.7 (L) 03/25/2015        Past Medical History:  Diagnosis Date  . Hyperlipidemia    LDL 176 in 10/16, started atorvastatin    No past surgical history on file.  Family History  Problem Relation Age of Onset  . Hyperlipidemia Mother   . Diabetes Maternal Grandmother     Social  History:  reports that she has never smoked. She has never used smokeless tobacco. Her alcohol and drug histories are not on file.  Allergies: No Known Allergies  Allergies as of 04/06/2016   No Known Allergies     Medication List       Accurate as of 04/06/16  4:49 PM. Always use your most recent med list.          atorvastatin 20 MG tablet Commonly known as:  LIPITOR Take 20 mg by mouth daily.   cabergoline 0.5 MG tablet Commonly known as:  DOSTINEX TAKE 1 TABLET THE FIRST OF THE WEEK, AND THEN 1/2 TABLET THE SECOND TIME OF THE WEEK   ciprofloxacin 500 MG/5ML (10%) suspension Commonly known as:  CIPRO Take 500 mg by mouth 2 (two) times daily. FOR 7 DAYS   docusate sodium 100 MG capsule Commonly known as:  COLACE Take 1 capsule (100 mg total) by mouth every 12 (twelve) hours.   HYDROcodone-acetaminophen 5-325 MG tablet Commonly known as:  NORCO/VICODIN Take 1-2 tablets by mouth every 6 (six) hours as needed for severe pain.   ibuprofen 400 MG tablet Commonly known as:  ADVIL,MOTRIN Take 400 mg by mouth every 6 (six) hours as needed.   ondansetron 4 MG disintegrating tablet Commonly known as:  ZOFRAN ODT 32m ODT q4 hours prn nausea/vomit  LABS:  Admission on 04/04/2016, Discharged on 04/04/2016  Component Date Value Ref Range Status  . WBC 04/04/2016 10.2  4.0 - 10.5 K/uL Final  . RBC 04/04/2016 4.23  3.87 - 5.11 MIL/uL Final  . Hemoglobin 04/04/2016 12.3  12.0 - 15.0 g/dL Final  . HCT 04/04/2016 37.5  36.0 - 46.0 % Final  . MCV 04/04/2016 88.7  78.0 - 100.0 fL Final  . MCH 04/04/2016 29.1  26.0 - 34.0 pg Final  . MCHC 04/04/2016 32.8  30.0 - 36.0 g/dL Final  . RDW 04/04/2016 13.3  11.5 - 15.5 % Final  . Platelets 04/04/2016 258  150 - 400 K/uL Final  . Neutrophils Relative % 04/04/2016 80  % Final  . Neutro Abs 04/04/2016 8.2* 1.7 - 7.7 K/uL Final  . Lymphocytes Relative 04/04/2016 15  % Final  . Lymphs Abs 04/04/2016 1.5  0.7 - 4.0 K/uL Final  .  Monocytes Relative 04/04/2016 5  % Final  . Monocytes Absolute 04/04/2016 0.5  0.1 - 1.0 K/uL Final  . Eosinophils Relative 04/04/2016 0  % Final  . Eosinophils Absolute 04/04/2016 0.0  0.0 - 0.7 K/uL Final  . Basophils Relative 04/04/2016 0  % Final  . Basophils Absolute 04/04/2016 0.0  0.0 - 0.1 K/uL Final  . Sodium 04/04/2016 132* 135 - 145 mmol/L Final  . Potassium 04/04/2016 3.3* 3.5 - 5.1 mmol/L Final  . Chloride 04/04/2016 99* 101 - 111 mmol/L Final  . CO2 04/04/2016 22  22 - 32 mmol/L Final  . Glucose, Bld 04/04/2016 113* 65 - 99 mg/dL Final  . BUN 04/04/2016 13  6 - 20 mg/dL Final  . Creatinine, Ser 04/04/2016 1.03* 0.44 - 1.00 mg/dL Final  . Calcium 04/04/2016 8.8* 8.9 - 10.3 mg/dL Final  . Total Protein 04/04/2016 7.9  6.5 - 8.1 g/dL Final  . Albumin 04/04/2016 4.3  3.5 - 5.0 g/dL Final  . AST 04/04/2016 17  15 - 41 U/L Final  . ALT 04/04/2016 15  14 - 54 U/L Final  . Alkaline Phosphatase 04/04/2016 44  38 - 126 U/L Final  . Total Bilirubin 04/04/2016 0.8  0.3 - 1.2 mg/dL Final  . GFR calc non Af Amer 04/04/2016 >60  >60 mL/min Final  . GFR calc Af Amer 04/04/2016 >60  >60 mL/min Final   Comment: (NOTE) The eGFR has been calculated using the CKD EPI equation. This calculation has not been validated in all clinical situations. eGFR's persistently <60 mL/min signify possible Chronic Kidney Disease.   . Anion gap 04/04/2016 11  5 - 15 Final  . Lipase 04/04/2016 23  11 - 51 U/L Final  . I-stat hCG, quantitative 04/04/2016 <5.0  <5 mIU/mL Final  . Comment 3 04/04/2016          Final   Comment:   GEST. AGE      CONC.  (mIU/mL)   <=1 WEEK        5 - 50     2 WEEKS       50 - 500     3 WEEKS       100 - 10,000     4 WEEKS     1,000 - 30,000        FEMALE AND NON-PREGNANT FEMALE:     LESS THAN 5 mIU/mL   Admission on 04/03/2016, Discharged on 04/03/2016  Component Date Value Ref Range Status  . Lipase 04/03/2016 14  11 - 51 U/L Final  . Sodium  04/03/2016 137  135 - 145  mmol/L Final  . Potassium 04/03/2016 3.5  3.5 - 5.1 mmol/L Final  . Chloride 04/03/2016 103  101 - 111 mmol/L Final  . CO2 04/03/2016 23  22 - 32 mmol/L Final  . Glucose, Bld 04/03/2016 107* 65 - 99 mg/dL Final  . BUN 04/03/2016 12  6 - 20 mg/dL Final  . Creatinine, Ser 04/03/2016 0.90  0.44 - 1.00 mg/dL Final  . Calcium 04/03/2016 9.2  8.9 - 10.3 mg/dL Final  . Total Protein 04/03/2016 7.5  6.5 - 8.1 g/dL Final  . Albumin 04/03/2016 4.3  3.5 - 5.0 g/dL Final  . AST 04/03/2016 20  15 - 41 U/L Final  . ALT 04/03/2016 16  14 - 54 U/L Final  . Alkaline Phosphatase 04/03/2016 42  38 - 126 U/L Final  . Total Bilirubin 04/03/2016 0.3  0.3 - 1.2 mg/dL Final  . GFR calc non Af Amer 04/03/2016 >60  >60 mL/min Final  . GFR calc Af Amer 04/03/2016 >60  >60 mL/min Final   Comment: (NOTE) The eGFR has been calculated using the CKD EPI equation. This calculation has not been validated in all clinical situations. eGFR's persistently <60 mL/min signify possible Chronic Kidney Disease.   . Anion gap 04/03/2016 11  5 - 15 Final  . WBC 04/03/2016 9.8  4.0 - 10.5 K/uL Final  . RBC 04/03/2016 4.15  3.87 - 5.11 MIL/uL Final  . Hemoglobin 04/03/2016 11.9* 12.0 - 15.0 g/dL Final  . HCT 04/03/2016 36.4  36.0 - 46.0 % Final  . MCV 04/03/2016 87.7  78.0 - 100.0 fL Final  . MCH 04/03/2016 28.7  26.0 - 34.0 pg Final  . MCHC 04/03/2016 32.7  30.0 - 36.0 g/dL Final  . RDW 04/03/2016 13.3  11.5 - 15.5 % Final  . Platelets 04/03/2016 252  150 - 400 K/uL Final  . Color, Urine 04/03/2016 YELLOW  YELLOW Final  . APPearance 04/03/2016 CLEAR  CLEAR Final  . Specific Gravity, Urine 04/03/2016 1.010  1.005 - 1.030 Final  . pH 04/03/2016 5.0  5.0 - 8.0 Final  . Glucose, UA 04/03/2016 NEGATIVE  NEGATIVE mg/dL Final  . Hgb urine dipstick 04/03/2016 NEGATIVE  NEGATIVE Final  . Bilirubin Urine 04/03/2016 NEGATIVE  NEGATIVE Final  . Ketones, ur 04/03/2016 NEGATIVE  NEGATIVE mg/dL Final  . Protein, ur 04/03/2016 NEGATIVE   NEGATIVE mg/dL Final  . Nitrite 04/03/2016 NEGATIVE  NEGATIVE Final  . Leukocytes, UA 04/03/2016 NEGATIVE  NEGATIVE Final  . I-stat hCG, quantitative 04/03/2016 <5.0  <5 mIU/mL Final  . Comment 3 04/03/2016          Final   Comment:   GEST. AGE      CONC.  (mIU/mL)   <=1 WEEK        5 - 50     2 WEEKS       50 - 500     3 WEEKS       100 - 10,000     4 WEEKS     1,000 - 30,000        FEMALE AND NON-PREGNANT FEMALE:     LESS THAN 5 mIU/mL        Review of Systems     PHYSICAL EXAM:  BP 112/80   Pulse 87   Wt 163 lb (73.9 kg)   LMP 03/16/2016   SpO2 98%   BMI 25.53 kg/m    ASSESSMENT:   History of amenorrhea and  hyperprolactinemia  from a 7 mm prolactinoma  She had been maintained on Dostinex now for several years and unable to stop this without getting relapse of her hyperprolactinemia Last month her prolactin level was low normal with continuing the dose of 1 alternating with a half tablet of Dostinex per week Has had no change in her menstrual cycles also  Currently the patient is out of her medication but she thinks she can get it refilled when she gets her insurance   PLAN:    Continue cabergoline twice a week, now she will be taking half tablet twice a week.  Follow-up in 3 months to review her progress and consider tapering the medication further if prolactin levels are still low normal    Elli Groesbeck 04/06/2016, 4:49 PM

## 2016-04-06 NOTE — Patient Instructions (Signed)
Take 1/2 twice daily

## 2016-06-10 IMAGING — MR MR HEAD WO/W CM
18 of 19 series · 38 of 48 positions shown · IV contrast (multihance)
Comparison: None.

CLINICAL DATA: Possible prolactinoma. Elevated prolactin levels for
3 years.

EXAM:
MRI HEAD WITHOUT AND WITH CONTRAST
TECHNIQUE: Multiplanar, multiecho pulse sequences of the brain and surrounding
structures were obtained without and with intravenous contrast.
CONTRAST:  8mL MULTIHANCE GADOBENATE DIMEGLUMINE 529 MG/ML IV SOLN

[Series 2: T1 · sagittal · 5.0mm · 0.45mm/px · 1 of 19 slices shown]
[im 1/19]
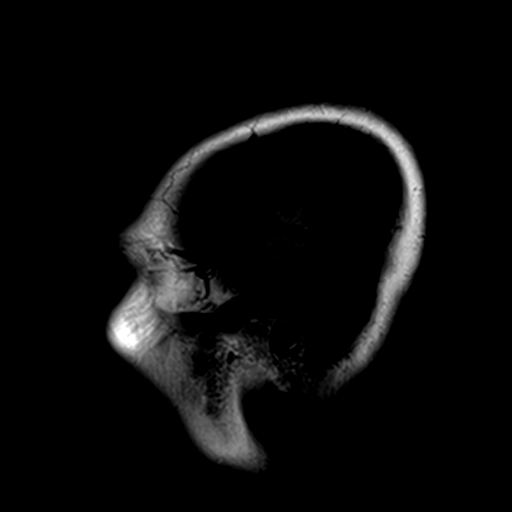

[Series 3: DWI · axial · 5.0mm · 1.80mm/px · z∈[-32,+98]mm · 4 of 39 slices shown (1 of 2)]
[im 1/39]
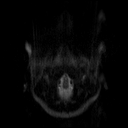
[im 13/39]
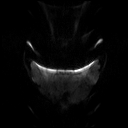
[im 26/39]
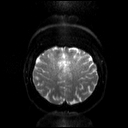
[im 39/39]
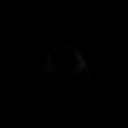

[Series 4: DWI · axial · 5.0mm · 1.80mm/px · z∈[-32,+98]mm · 2 of 21 slices shown (2 of 2)]
[im 1/21]
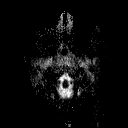
[im 21/21]
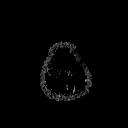

[Series 5: T2 · axial · 5.0mm · 0.51mm/px · z∈[-34,+95]mm · 2 of 21 slices shown]
[im 1/21]
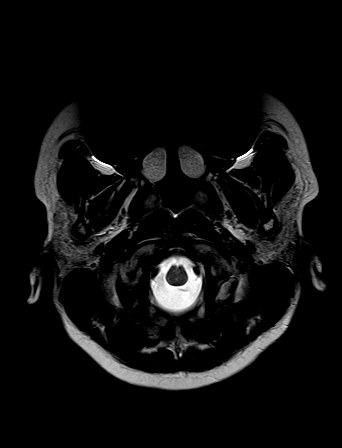
[im 21/21]
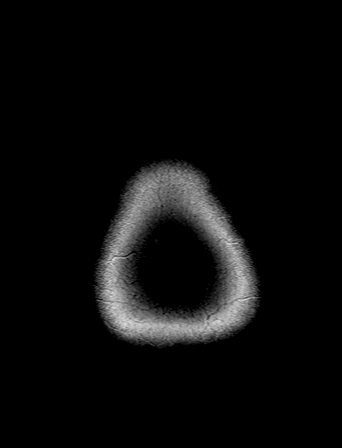

[Series 6: mip_images(sw) · axial · 16.0mm · 0.90mm/px · z∈[-41,+102]mm · 8 of 73 slices shown]
[im 1/73]
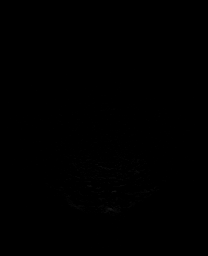
[im 11/73]
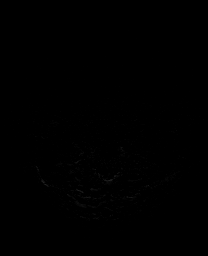
[im 21/73]
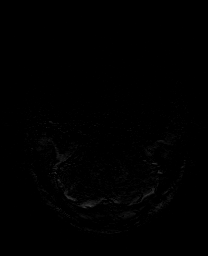
[im 31/73]
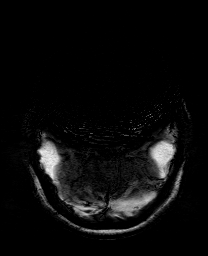
[im 42/73]
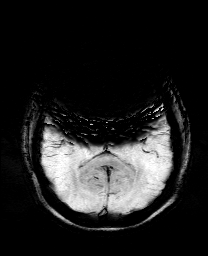
[im 52/73]
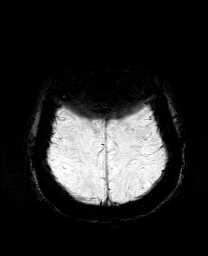
[im 62/73]
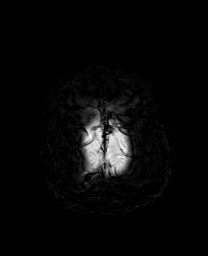
[im 73/73]
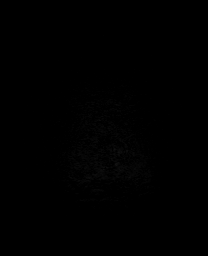

[Series 7: swi_images · axial · 2.0mm · 0.90mm/px · z∈[-48,+109]mm · 8 of 80 slices shown]
[im 1/80]
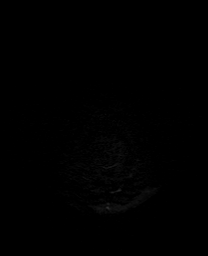
[im 10/80]
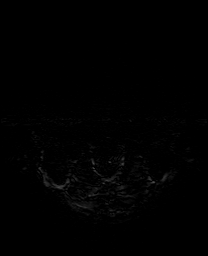
[im 20/80]
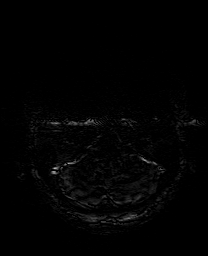
[im 30/80]
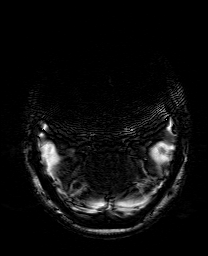
[im 50/80]
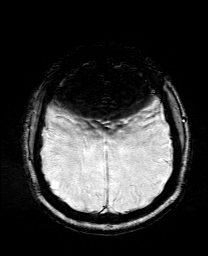
[im 60/80]
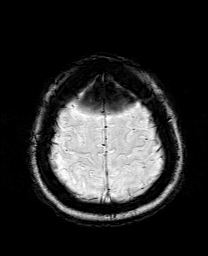
[im 70/80]
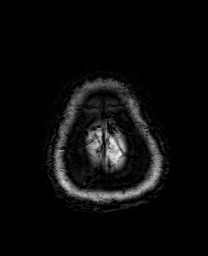
[im 80/80]
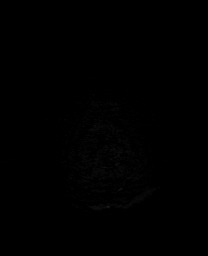

[Series 8: sag 3mm · sagittal · 3.0mm · 0.33mm/px · 1 of 11 slices shown]
[im 1/11]
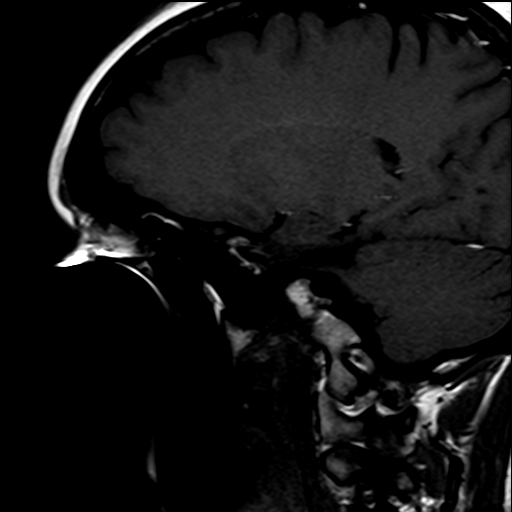

[Series 9: cor 3mm · coronal · 3.0mm · 0.33mm/px · 1 of 11 slices shown]
[im 1/11]
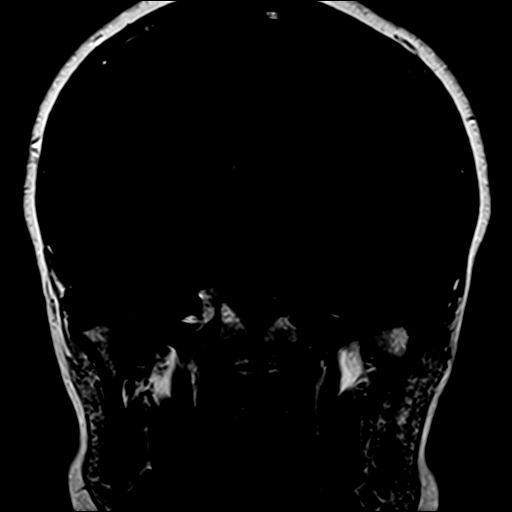

[Series 10: pre cor dynamic · coronal · non-contrast · 3.0mm · 0.35mm/px · 1 of 7 slices shown]
[im 1/7]
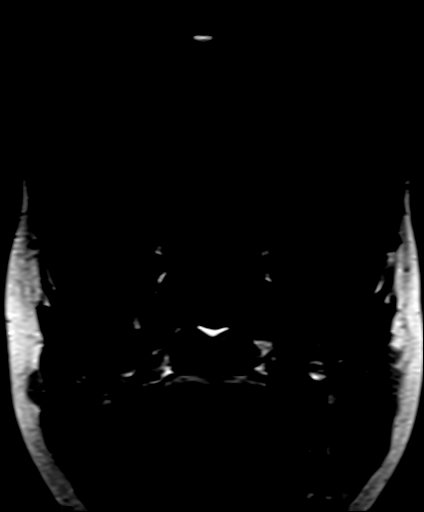

[Series 11: post fs cor · coronal · 3.0mm · 0.35mm/px · 1 of 7 slices shown (1 of 6)]
[im 1/7]
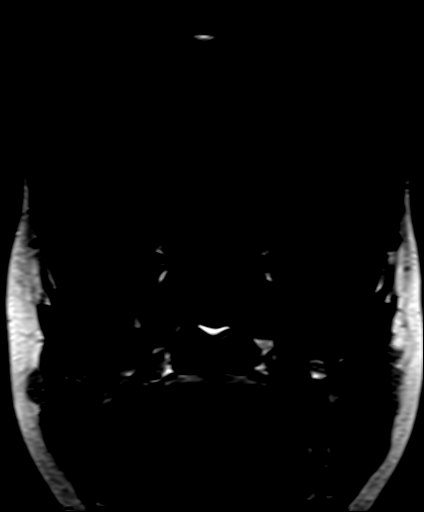

[Series 12: post fs cor · coronal · 3.0mm · 0.35mm/px · 1 of 7 slices shown (2 of 6)]
[im 1/7]
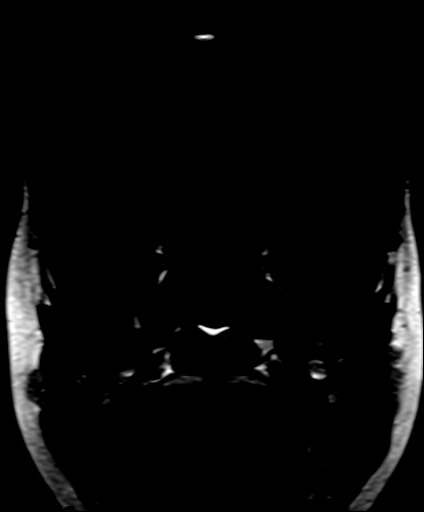

[Series 13: post fs cor · coronal · 3.0mm · 0.35mm/px · 1 of 7 slices shown (3 of 6)]
[im 1/7]
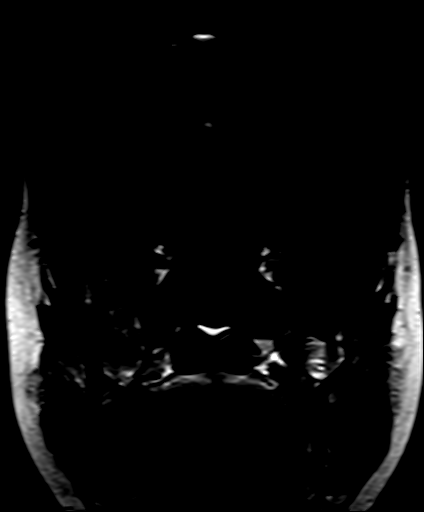

[Series 14: post fs cor · coronal · 3.0mm · 0.35mm/px · 1 of 7 slices shown (4 of 6)]
[im 1/7]
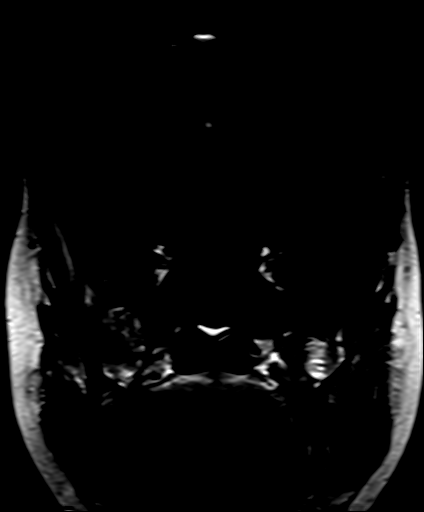

[Series 15: post fs cor · coronal · 3.0mm · 0.35mm/px · 1 of 7 slices shown (5 of 6)]
[im 1/7]
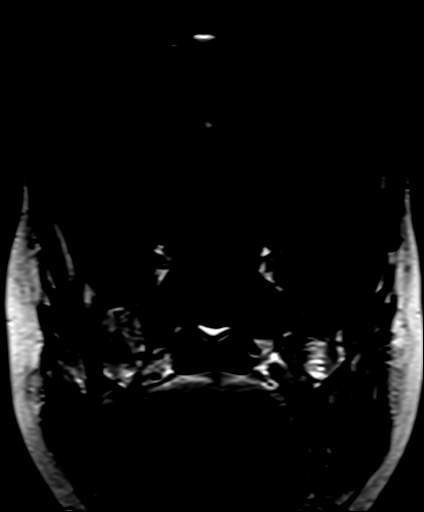

[Series 16: post fs cor · coronal · 3.0mm · 0.35mm/px · 1 of 7 slices shown (6 of 6)]
[im 1/7]
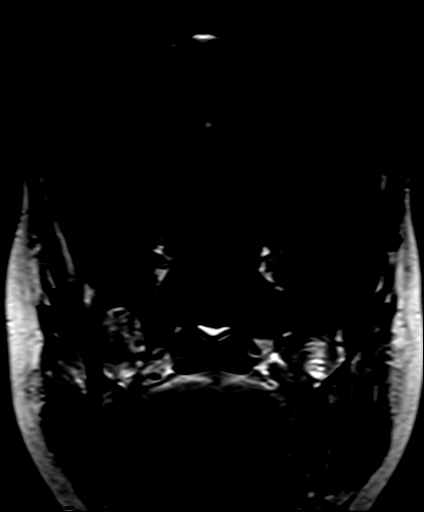

[Series 17: post sag 3mm · sagittal · 3.0mm · 0.33mm/px · 1 of 11 slices shown]
[im 1/11]
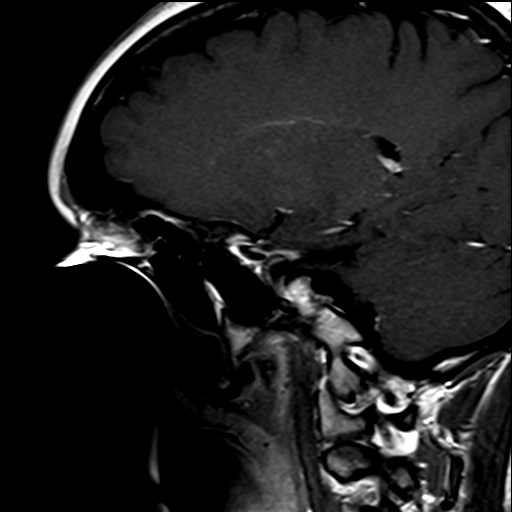

[Series 18: post cor 3mm · coronal · 3.0mm · 0.33mm/px · 1 of 11 slices shown]
[im 1/11]
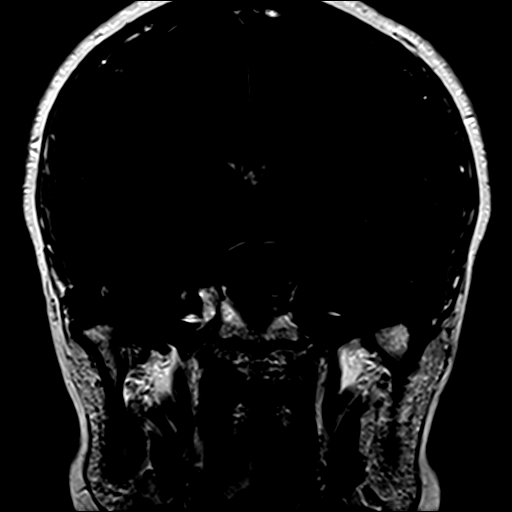

[Series 19: post_t1_mpr_tra · axial · 2.0mm · 0.45mm/px · z∈[-40,-20]mm · 2 of 72 slices shown]
[im 1/72]
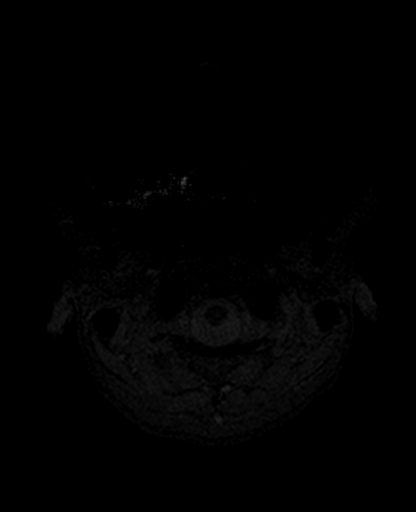
[im 11/72]
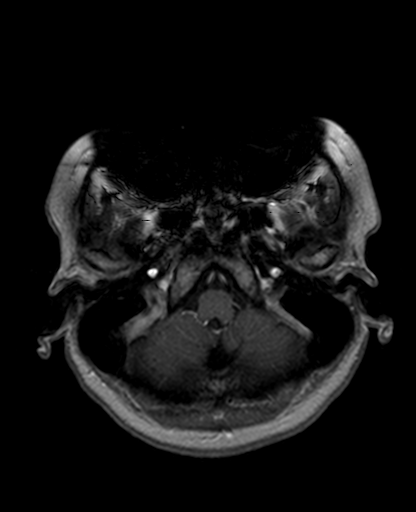

[38 of 48 positions shown; findings below may reference images not displayed]

FINDINGS: The patient has braces which results in significant artifact. Metal
reduction techniques were employed.

No evidence for acute infarction, hemorrhage, intra-axial mass
lesion, hydrocephalus, or extra-axial fluid. Normal cerebral volume.
No white matter disease. Flow voids are maintained. Cerebellar
tonsils are above the foramen magnum. Extracranial soft tissues are
unremarkable within limits for visualization due to braces.

Thin-section imaging through the pituitary was performed pre and
post-contrast including dynamic sequences. In the far lateral RIGHT
gland, there is an area of differential enhancement, 4 x 7 x 5 mm
(R-L x A-P x C-C), concerning for a prolactinoma. The stalk is
slightly deviated right-to-left away from the lesion, but overall
the gland height is normal. The lesion may project slightly into the
RIGHT cavernous sinus but it is difficult to confidently assess this
given the distortion from the patient's braces. No mass effect on
the internal carotid artery. No suprasellar extension.

The remainder of the post infusion appearance of the brain is
unremarkable.
IMPRESSION: Findings suspicious for a microadenoma/prolactinoma measuring 4 x 7
x 5 mm, far-lateral on the RIGHT. See discussion above. No
suprasellar extension.

## 2016-07-04 ENCOUNTER — Other Ambulatory Visit: Payer: Self-pay

## 2016-07-07 ENCOUNTER — Ambulatory Visit: Payer: Self-pay | Admitting: Endocrinology

## 2016-08-16 ENCOUNTER — Other Ambulatory Visit: Payer: Self-pay

## 2016-08-16 DIAGNOSIS — D352 Benign neoplasm of pituitary gland: Secondary | ICD-10-CM

## 2016-08-17 LAB — PROLACTIN: PROLACTIN: 106.6 ng/mL — AB (ref 4.8–23.3)

## 2016-08-18 ENCOUNTER — Ambulatory Visit (INDEPENDENT_AMBULATORY_CARE_PROVIDER_SITE_OTHER): Payer: Self-pay | Admitting: Endocrinology

## 2016-08-18 ENCOUNTER — Encounter: Payer: Self-pay | Admitting: Endocrinology

## 2016-08-18 VITALS — BP 124/76 | HR 85 | Ht 66.75 in | Wt 162.4 lb

## 2016-08-18 DIAGNOSIS — D352 Benign neoplasm of pituitary gland: Secondary | ICD-10-CM

## 2016-08-18 MED ORDER — BROMOCRIPTINE MESYLATE 2.5 MG PO TABS: 2.5000 mg | ORAL_TABLET | Freq: Every day | ORAL | 5 refills | Status: DC

## 2016-08-18 NOTE — Progress Notes (Addendum)
Patient ID: Michele Ortiz, female   DOB: December 19, 1970, 46 y.o.   MRN: [id number]           Chief complaint:  Follow-up of prolactinoma  History of Present Illness:  The patient had been seen in 2013 when she was having progressive decline in her menstrual bleeding and eventually stopped having menstrual cycles.  At that time her prolactin level was 109 She did not follow-up or get her MRI scan because of lack of insurance When she saw her new gynecologist in 11/2014 she was still not having any menstrual cycles, no history of any milky discharge from the breasts.  She has not had any previous pregnancies except when she was around 20 and this was terminated  Baseline prolactin level was 72.5  MRI showed potential 7 mm microadenoma on the right   She was started on CABERGOLINE 0.5 mg, half tablet twice a week in 12/ 16 With this she has had regular menstrual cycles  Since her prolactin was down to 4.7 in 1/17 she was told to take medication once a week but she misunderstood and stopped it completely. This resulted in her prolactin going up back up to 72  She has subsequently been taking 1 tablet on Wednesdays and a half tablet on Sundays She takes her medication during the day at work and this causes less dizziness compared to bedtime doses  Recently her menstrual cycles are coming monthly  and she has no milk discharged from the breasts No headaches  Prolactin level was mildly decreased last month when she had her labs  Lab Results  Component Value Date   PROLACTIN 106.6 (H) 08/16/2016   PROLACTIN 4.0 (L) 03/18/2016   PROLACTIN 9.7 09/18/2015   PROLACTIN 31.5 (H) 07/28/2015   PROLACTIN 72.2 (H) 06/22/2015   PROLACTIN 4.7 (L) 03/25/2015        Past Medical History:  Diagnosis Date  . Hyperlipidemia    LDL 176 in 10/16, started atorvastatin    No past surgical history on file.  Family History  Problem Relation Age of Onset  . Hyperlipidemia Mother   . Diabetes  Maternal Grandmother     Social History:  reports that she has never smoked. She has never used smokeless tobacco. Her alcohol and drug histories are not on file.  Allergies: No Known Allergies  Allergies as of 08/18/2016   No Known Allergies     Medication List    as of 08/18/2016  4:34 PM   You have not been prescribed any medications.     LABS:  Lab on 08/16/2016  Component Date Value Ref Range Status  . Prolactin 08/16/2016 106.6* 4.8 - 23.3 ng/mL Final       Review of Systems     PHYSICAL EXAM:  BP 124/76   Pulse 85   Ht 5' 6.75" (1.695 m)   Wt 162 lb 6.4 oz (73.7 kg)   SpO2 99%   BMI 25.63 kg/m    ASSESSMENT:   History of amenorrhea and hyperprolactinemia  from a 7 mm prolactinoma  She had been maintained on Dostinex now for several years and unable to stop this without getting relapse of her hyperprolactinemia Last month her prolactin level was low normal with continuing the dose of 1 alternating with a half tablet of Dostinex per week Has had no change in her menstrual cycles also  Currently the patient is out of her medication but she thinks she can get it refilled when she gets her insurance  PLAN:    Continue cabergoline twice a week, now she will be taking half tablet twice a week.  Follow-up in 3 months to review her progress and consider tapering the medication further if prolactin levels are still low normal    Shealee Yordy 08/18/2016, 4:34 PM

## 2016-10-16 ENCOUNTER — Other Ambulatory Visit: Payer: Self-pay | Admitting: Endocrinology

## 2016-10-16 DIAGNOSIS — D352 Benign neoplasm of pituitary gland: Secondary | ICD-10-CM

## 2016-10-17 ENCOUNTER — Other Ambulatory Visit: Payer: Self-pay

## 2016-10-21 ENCOUNTER — Ambulatory Visit: Payer: Self-pay | Admitting: Endocrinology

## 2016-12-21 ENCOUNTER — Ambulatory Visit: Payer: Self-pay | Admitting: Endocrinology

## 2017-02-06 ENCOUNTER — Ambulatory Visit: Payer: Self-pay | Admitting: Endocrinology

## 2017-08-07 ENCOUNTER — Other Ambulatory Visit: Payer: Self-pay

## 2017-08-07 DIAGNOSIS — E785 Hyperlipidemia, unspecified: Secondary | ICD-10-CM | POA: Diagnosis not present

## 2017-08-07 DIAGNOSIS — R200 Anesthesia of skin: Secondary | ICD-10-CM | POA: Diagnosis not present

## 2017-08-07 DIAGNOSIS — E559 Vitamin D deficiency, unspecified: Secondary | ICD-10-CM | POA: Diagnosis not present

## 2017-08-07 DIAGNOSIS — R7303 Prediabetes: Secondary | ICD-10-CM | POA: Diagnosis not present

## 2017-08-07 DIAGNOSIS — D352 Benign neoplasm of pituitary gland: Secondary | ICD-10-CM

## 2017-08-08 LAB — PROLACTIN: Prolactin: 101.9 ng/mL — ABNORMAL HIGH (ref 4.8–23.3)

## 2017-08-09 ENCOUNTER — Ambulatory Visit (INDEPENDENT_AMBULATORY_CARE_PROVIDER_SITE_OTHER): Payer: BLUE CROSS/BLUE SHIELD | Admitting: Endocrinology

## 2017-08-09 ENCOUNTER — Encounter: Payer: Self-pay | Admitting: Endocrinology

## 2017-08-09 VITALS — BP 130/82 | HR 85 | Ht 66.75 in | Wt 160.8 lb

## 2017-08-09 DIAGNOSIS — D352 Benign neoplasm of pituitary gland: Secondary | ICD-10-CM | POA: Diagnosis not present

## 2017-08-09 MED ORDER — CABERGOLINE 0.5 MG PO TABS: 0.2500 mg | ORAL_TABLET | ORAL | 2 refills | Status: DC

## 2017-08-09 NOTE — Progress Notes (Signed)
Patient ID: Michele Ortiz, female   DOB: [birth year], 47 y.o.   MRN: [id number]           Chief complaint:  Follow-up of prolactinoma  History of Present Illness:  The patient had been seen in 2013 when she was having progressive decline in her menstrual bleeding and eventually stopped having menstrual cycles.  At that time her prolactin level was 109 She did not follow-up or get her MRI scan because of lack of insurance When she saw her new gynecologist in 11/2014 she was still not having any menstrual cycles, no history of any milky discharge from the breasts.  She has not had any previous pregnancies except when she was around 20 and this was terminated  Baseline prolactin level was 72.5  MRI showed likely 7 mm microadenoma on the right in 2016   She was started on CABERGOLINE 0.5 mg, half tablet twice a week in 12/ 16 With this she has had regular menstrual cycles  Since her prolactin was down to 4.7 in 1/17 she was told to take medication once a week but she misunderstood and stopped it completely. This resulted in her prolactin going up back up to 72 She was then restarted on Dostinex She would previously take her medication during the day at work and this causes less dizziness compared to bedtime doses  On her last visit in 6/18 she had not been taking her medication partly because of insurance problems Recently her menstrual cycles are coming monthly  and she has no milk discharged from the breasts  No headaches  Prolactin level was over 100 last year and is still about the same    Lab Results  Component Value Date   PROLACTIN 101.9 (H) 08/07/2017   PROLACTIN 106.6 (H) 08/16/2016   PROLACTIN 4.0 (L) 03/18/2016   PROLACTIN 9.7 09/18/2015   PROLACTIN 31.5 (H) 07/28/2015   PROLACTIN 72.2 (H) 06/22/2015   PROLACTIN 4.7 (L) 03/25/2015        Past Medical History:  Diagnosis Date  . Hyperlipidemia    LDL 176 in 10/16, started atorvastatin    History  reviewed. No pertinent surgical history.  Family History  Problem Relation Age of Onset  . Hyperlipidemia Mother   . Diabetes Maternal Grandmother     Social History:  reports that she has never smoked. She has never used smokeless tobacco. Her alcohol and drug histories are not on file.  Allergies: No Known Allergies  Allergies as of 08/09/2017   No Known Allergies     Medication List    as of 08/09/2017  2:07 PM   You have not been prescribed any medications.     LABS:  Lab on 08/07/2017  Component Date Value Ref Range Status  . Prolactin 08/07/2017 101.9* 4.8 - 23.3 ng/mL Final       Review of Systems     PHYSICAL EXAM:  BP 130/82 (BP Location: Left Arm, Patient Position: Sitting, Cuff Size: Normal)   Pulse 85   Ht 5' 6.75" (1.695 m)   Wt 160 lb 12.8 oz (72.9 kg)   SpO2 99%   BMI 25.37 kg/m    ASSESSMENT:   History of amenorrhea and hyperprolactinemia  from a 7 mm prolactinoma  She had been maintained on Dostinex now for several years and unable to stop this without getting relapse of her hyperprolactinemia  Again she is not taking her medication as directed because of cost but she is willing to take this again Surprisingly  she has no change in her menstrual cycles despite not taking her Dostinex for over a year now  Since she has a 7 mm adenoma and previously did have amenorrhea she does need to be on treatment    PLAN:    Restart cabergoline twice a week, for now she will be taking half tablet twice a week. Given her written prescription and she can check the cost of the medication at different pharmacies  Follow-up in 3 months to review her progress and most likely may need to continue that for a few years until menopause    [name] 08/09/2017, 2:07 PM

## 2017-09-18 ENCOUNTER — Other Ambulatory Visit (HOSPITAL_COMMUNITY)
Admission: RE | Admit: 2017-09-18 | Discharge: 2017-09-18 | Disposition: A | Discharge status: Home / Self Care | Payer: BLUE CROSS/BLUE SHIELD | Source: Ambulatory Visit | Attending: Obstetrics and Gynecology | Admitting: Obstetrics and Gynecology

## 2017-09-18 ENCOUNTER — Other Ambulatory Visit: Payer: Self-pay | Admitting: Obstetrics and Gynecology

## 2017-09-18 DIAGNOSIS — Z01411 Encounter for gynecological examination (general) (routine) with abnormal findings: Secondary | ICD-10-CM | POA: Insufficient documentation

## 2017-09-20 LAB — CYTOLOGY - PAP
Diagnosis: NEGATIVE
HPV: NOT DETECTED

## 2017-11-20 ENCOUNTER — Other Ambulatory Visit (INDEPENDENT_AMBULATORY_CARE_PROVIDER_SITE_OTHER): Payer: BLUE CROSS/BLUE SHIELD

## 2017-11-20 DIAGNOSIS — D352 Benign neoplasm of pituitary gland: Secondary | ICD-10-CM

## 2017-11-21 LAB — PROLACTIN: PROLACTIN: 11.8 ng/mL (ref 4.8–23.3)

## 2017-11-24 ENCOUNTER — Ambulatory Visit (INDEPENDENT_AMBULATORY_CARE_PROVIDER_SITE_OTHER): Payer: BLUE CROSS/BLUE SHIELD | Admitting: Endocrinology

## 2017-11-24 ENCOUNTER — Encounter: Payer: Self-pay | Admitting: Endocrinology

## 2017-11-24 VITALS — BP 132/88 | HR 64 | Ht 67.0 in | Wt 163.0 lb

## 2017-11-24 DIAGNOSIS — D352 Benign neoplasm of pituitary gland: Secondary | ICD-10-CM | POA: Diagnosis not present

## 2017-11-24 NOTE — Progress Notes (Signed)
Patient ID: Michele Ortiz, female   DOB: 08-25-1970, 47 y.o.   MRN: [id number]           Chief complaint:  Follow-up of prolactinoma  History of Present Illness:  The patient had been seen in 2013 when she was having progressive decline in her menstrual bleeding and eventually stopped having menstrual cycles.  At that time her prolactin level was 109 She did not follow-up or get her MRI scan because of lack of insurance When she saw her new gynecologist in 11/2014 she was still not having any menstrual cycles, no history of any milky discharge from the breasts.  She has not had any previous pregnancies except when she was around 20 and this was terminated  Baseline prolactin level was 72.5  MRI showed likely 7 mm microadenoma on the right in 2016   She was started on CABERGOLINE 0.5 mg, half tablet twice a week in 12/ 16 With this she has had regular menstrual cycles  Since her prolactin was down to 4.7 in 1/17 she was told to take medication once a week but she misunderstood and stopped it completely. This resulted in her prolactin going up back up to 72 She was then restarted on Dostinex  Previously and 6/18 she had not been taking her medication partly because of insurance problems Follow-up prolactin was 102 and she is back on DOSTINEX half tablet twice a week since 07/2017 She tries to take this later in the day since it otherwise would make her drowsy  Recently her menstrual cycles are regular every month  and she has no milk discharged from the breasts No headaches  Prolactin level is now improved at 11.8   Lab Results  Component Value Date   PROLACTIN 11.8 11/20/2017   PROLACTIN 101.9 (H) 08/07/2017   PROLACTIN 106.6 (H) 08/16/2016   PROLACTIN 4.0 (L) 03/18/2016   PROLACTIN 9.7 09/18/2015   PROLACTIN 31.5 (H) 07/28/2015   PROLACTIN 72.2 (H) 06/22/2015   PROLACTIN 4.7 (L) 03/25/2015        Past Medical History:  Diagnosis Date  . Hyperlipidemia    LDL  176 in 10/16, started atorvastatin    No past surgical history on file.  Family History  Problem Relation Age of Onset  . Hyperlipidemia Mother   . Diabetes Maternal Grandmother     Social History:  reports that she has never smoked. She has never used smokeless tobacco. Her alcohol and drug histories are not on file.  Allergies: No Known Allergies  Allergies as of 11/24/2017   No Known Allergies     Medication List        Accurate as of 11/24/17  3:46 PM. Always use your most recent med list.          cabergoline 0.5 MG tablet Commonly known as:  DOSTINEX Take 0.5 tablets (0.25 mg total) by mouth 2 (two) times a week.       LABS:  Lab on 11/20/2017  Component Date Value Ref Range Status  . Prolactin 11/20/2017 11.8  4.8 - 23.3 ng/mL Final       Review of Systems     PHYSICAL EXAM:  BP 132/88   Pulse 64   Ht 5\' 7"  (1.702 m)   Wt 163 lb (73.9 kg)   SpO2 96%   BMI 25.53 kg/m    ASSESSMENT:   History of amenorrhea and hyperprolactinemia  from a 7 mm prolactinoma  She had been back on cabergoline She is taking  half tablet twice a week and is usually tolerating this well Prolactin level is back to normal  Pituitary tumor: Previously had 7 mm adenoma likely prolactinoma      PLAN:    Continue cabergoline twice a week, for now she will be taking half tablet twice a week.   Follow-up in 6 months   [name] 11/24/2017, 3:46 PM

## 2018-05-10 DIAGNOSIS — R6883 Chills (without fever): Secondary | ICD-10-CM | POA: Diagnosis not present

## 2018-05-25 ENCOUNTER — Other Ambulatory Visit: Payer: BLUE CROSS/BLUE SHIELD

## 2018-05-28 ENCOUNTER — Ambulatory Visit: Payer: BLUE CROSS/BLUE SHIELD | Admitting: Endocrinology

## 2018-09-26 DIAGNOSIS — Z01411 Encounter for gynecological examination (general) (routine) with abnormal findings: Secondary | ICD-10-CM | POA: Diagnosis not present

## 2018-09-26 DIAGNOSIS — N898 Other specified noninflammatory disorders of vagina: Secondary | ICD-10-CM | POA: Diagnosis not present

## 2018-09-26 DIAGNOSIS — D259 Leiomyoma of uterus, unspecified: Secondary | ICD-10-CM | POA: Diagnosis not present

## 2018-09-26 DIAGNOSIS — N920 Excessive and frequent menstruation with regular cycle: Secondary | ICD-10-CM | POA: Diagnosis not present

## 2018-09-26 DIAGNOSIS — Z113 Encounter for screening for infections with a predominantly sexual mode of transmission: Secondary | ICD-10-CM | POA: Diagnosis not present

## 2019-06-07 DIAGNOSIS — Z23 Encounter for immunization: Secondary | ICD-10-CM | POA: Diagnosis not present

## 2019-06-13 DIAGNOSIS — M25562 Pain in left knee: Secondary | ICD-10-CM | POA: Diagnosis not present

## 2019-10-29 ENCOUNTER — Other Ambulatory Visit: Payer: Self-pay | Admitting: Endocrinology

## 2019-10-29 DIAGNOSIS — D352 Benign neoplasm of pituitary gland: Secondary | ICD-10-CM

## 2019-10-30 ENCOUNTER — Other Ambulatory Visit: Payer: BC Managed Care – PPO

## 2019-10-30 ENCOUNTER — Other Ambulatory Visit: Payer: BLUE CROSS/BLUE SHIELD

## 2019-10-30 DIAGNOSIS — D352 Benign neoplasm of pituitary gland: Secondary | ICD-10-CM | POA: Diagnosis not present

## 2019-10-31 LAB — PROLACTIN: Prolactin: 75.7 ng/mL — ABNORMAL HIGH (ref 4.8–23.3)

## 2019-11-01 ENCOUNTER — Encounter: Payer: Self-pay | Admitting: Endocrinology

## 2019-11-01 ENCOUNTER — Ambulatory Visit (INDEPENDENT_AMBULATORY_CARE_PROVIDER_SITE_OTHER): Payer: BC Managed Care – PPO | Admitting: Endocrinology

## 2019-11-01 ENCOUNTER — Other Ambulatory Visit: Payer: Self-pay

## 2019-11-01 VITALS — BP 122/82 | HR 72 | Ht 67.0 in | Wt 160.0 lb

## 2019-11-01 DIAGNOSIS — D352 Benign neoplasm of pituitary gland: Secondary | ICD-10-CM | POA: Diagnosis not present

## 2019-11-01 MED ORDER — CABERGOLINE 0.5 MG PO TABS: 0.2500 mg | ORAL_TABLET | ORAL | 1 refills | Status: DC

## 2019-11-01 NOTE — Progress Notes (Signed)
Patient ID: Michele Ortiz, female   DOB: [birth year], 49 y.o.   MRN: [id number]           Chief complaint:  Follow-up of prolactinoma  History of Present Illness:  The patient had been seen in 2013 when she was having progressive decline in her menstrual bleeding and eventually stopped having menstrual cycles.  At that time her prolactin level was 109 She did not follow-up or get her MRI scan because of lack of insurance When she saw her new gynecologist in 11/2014 she was still not having any menstrual cycles, no history of any milky discharge from the breasts.  She has not had any previous pregnancies except when she was around 20 and this was terminated  Baseline prolactin level was 72.5   MRI showed likely 7 mm microadenoma on the right in 2016   She was started on CABERGOLINE 0.5 mg, half tablet twice a week in 12/ 16 With this she has had regular menstrual cycles  Since her prolactin was down to 4.7 in 1/17 she was told to take medication once a week but she misunderstood and stopped it completely. This resulted in her prolactin going up back up to 72 She was then restarted on Dostinex  Previously in 6/18 she had not been taking her medication partly because of insurance problems Follow-up prolactin was 102  She had been prescribed DOSTINEX half tablet twice a week since 07/2017 She has been taking Dostinex later in the day since it otherwise would make her drowsy  She ran out of her prescription about a year ago and did not come back for follow-up after her last visit in 2019  She has not had a menstrual cycle since about 5/21 Previously regular with taking Dostinex regularly and her prolactin had come down to normal  She also has had some recent headaches which is new, she did not know this is related to stress At night she may get some hot flashes also  Prolactin level is now significantly higher at 76 compared to 11.8 previously   Lab Results  Component Value  Date   PROLACTIN 75.7 (H) 10/30/2019   PROLACTIN 11.8 11/20/2017   PROLACTIN 101.9 (H) 08/07/2017   PROLACTIN 106.6 (H) 08/16/2016   PROLACTIN 4.0 (L) 03/18/2016   PROLACTIN 9.7 09/18/2015   PROLACTIN 31.5 (H) 07/28/2015   PROLACTIN 72.2 (H) 06/22/2015        Past Medical History:  Diagnosis Date  . Hyperlipidemia    LDL 176 in 10/16, started atorvastatin    No past surgical history on file.  Family History  Problem Relation Age of Onset  . Hyperlipidemia Mother   . Diabetes Maternal Grandmother     Social History:  reports that she has never smoked. She has never used smokeless tobacco. No history on file for alcohol use and drug use.  Allergies: No Known Allergies  Allergies as of 11/01/2019   No Known Allergies     Medication List       Accurate as of November 01, 2019 10:22 AM. If you have any questions, ask your nurse or doctor.        cabergoline 0.5 MG tablet Commonly known as: DOSTINEX Take 0.5 tablets (0.25 mg total) by mouth 2 (two) times a week.       LABS:  Appointment on 10/30/2019  Component Date Value Ref Range Status  . Prolactin 10/30/2019 75.7* 4.8 - 23.3 ng/mL Final       Review of Systems  Visual fields normal by confrontation  PHYSICAL EXAM:  BP 122/82 (BP Location: Left Arm, Patient Position: Sitting, Cuff Size: Normal)   Pulse 72   Ht 5\' 7"  (1.702 m)   Wt 160 lb (72.6 kg)   SpO2 97%   BMI 25.06 kg/m    ASSESSMENT:   History of amenorrhea and hyperprolactinemia  from a 7 mm prolactinoma  She had been previously well regulated on cabergoline, was taking half tablet twice a week without side effects She has not taken her medication for over a year As expected she is now having amenorrhea and a high prolactin level  Pituitary tumor: Previously had 7 mm adenoma likely prolactinoma, although she is having headaches now not clear if this is related    PLAN:    Restart 0.25 mg cabergoline twice a week Follow-up labs in  6 weeks and see me in the office in 4 months    Heike Pounds 11/01/2019, 10:22 AM

## 2019-11-07 DIAGNOSIS — R739 Hyperglycemia, unspecified: Secondary | ICD-10-CM | POA: Diagnosis not present

## 2019-11-07 DIAGNOSIS — E229 Hyperfunction of pituitary gland, unspecified: Secondary | ICD-10-CM | POA: Diagnosis not present

## 2019-11-07 DIAGNOSIS — Z0000 Encounter for general adult medical examination without abnormal findings: Secondary | ICD-10-CM | POA: Diagnosis not present

## 2019-11-07 DIAGNOSIS — M25511 Pain in right shoulder: Secondary | ICD-10-CM | POA: Diagnosis not present

## 2019-11-07 DIAGNOSIS — E785 Hyperlipidemia, unspecified: Secondary | ICD-10-CM | POA: Diagnosis not present

## 2019-11-07 DIAGNOSIS — E559 Vitamin D deficiency, unspecified: Secondary | ICD-10-CM | POA: Diagnosis not present

## 2019-11-12 DIAGNOSIS — K5904 Chronic idiopathic constipation: Secondary | ICD-10-CM | POA: Diagnosis not present

## 2019-11-12 DIAGNOSIS — R140 Abdominal distension (gaseous): Secondary | ICD-10-CM | POA: Diagnosis not present

## 2019-11-12 DIAGNOSIS — Z1211 Encounter for screening for malignant neoplasm of colon: Secondary | ICD-10-CM | POA: Diagnosis not present

## 2019-12-06 DIAGNOSIS — M7501 Adhesive capsulitis of right shoulder: Secondary | ICD-10-CM | POA: Diagnosis not present

## 2019-12-17 DIAGNOSIS — M7501 Adhesive capsulitis of right shoulder: Secondary | ICD-10-CM | POA: Diagnosis not present

## 2019-12-19 ENCOUNTER — Other Ambulatory Visit: Payer: Self-pay

## 2019-12-19 ENCOUNTER — Other Ambulatory Visit (INDEPENDENT_AMBULATORY_CARE_PROVIDER_SITE_OTHER): Payer: BC Managed Care – PPO

## 2019-12-19 DIAGNOSIS — D352 Benign neoplasm of pituitary gland: Secondary | ICD-10-CM

## 2019-12-20 LAB — PROLACTIN: Prolactin: 1.6 ng/mL — ABNORMAL LOW (ref 4.8–23.3)

## 2019-12-23 ENCOUNTER — Other Ambulatory Visit: Payer: Self-pay | Admitting: *Deleted

## 2019-12-23 MED ORDER — CABERGOLINE 0.5 MG PO TABS
ORAL_TABLET | ORAL | 1 refills | Status: DC
Start: 2019-12-23 — End: 2020-08-21

## 2019-12-24 DIAGNOSIS — M7501 Adhesive capsulitis of right shoulder: Secondary | ICD-10-CM | POA: Diagnosis not present

## 2019-12-26 DIAGNOSIS — M7501 Adhesive capsulitis of right shoulder: Secondary | ICD-10-CM | POA: Diagnosis not present

## 2019-12-30 DIAGNOSIS — M7501 Adhesive capsulitis of right shoulder: Secondary | ICD-10-CM | POA: Diagnosis not present

## 2020-01-01 DIAGNOSIS — M7501 Adhesive capsulitis of right shoulder: Secondary | ICD-10-CM | POA: Diagnosis not present

## 2020-01-03 DIAGNOSIS — K635 Polyp of colon: Secondary | ICD-10-CM | POA: Diagnosis not present

## 2020-01-03 DIAGNOSIS — Z1211 Encounter for screening for malignant neoplasm of colon: Secondary | ICD-10-CM | POA: Diagnosis not present

## 2020-01-03 DIAGNOSIS — D125 Benign neoplasm of sigmoid colon: Secondary | ICD-10-CM | POA: Diagnosis not present

## 2020-01-17 DIAGNOSIS — M7501 Adhesive capsulitis of right shoulder: Secondary | ICD-10-CM | POA: Diagnosis not present

## 2020-01-31 DIAGNOSIS — M7501 Adhesive capsulitis of right shoulder: Secondary | ICD-10-CM | POA: Diagnosis not present

## 2020-02-03 DIAGNOSIS — M7501 Adhesive capsulitis of right shoulder: Secondary | ICD-10-CM | POA: Diagnosis not present

## 2020-02-07 DIAGNOSIS — M7501 Adhesive capsulitis of right shoulder: Secondary | ICD-10-CM | POA: Diagnosis not present

## 2020-02-10 DIAGNOSIS — M7501 Adhesive capsulitis of right shoulder: Secondary | ICD-10-CM | POA: Diagnosis not present

## 2020-02-14 DIAGNOSIS — M7501 Adhesive capsulitis of right shoulder: Secondary | ICD-10-CM | POA: Diagnosis not present

## 2020-02-24 DIAGNOSIS — M7501 Adhesive capsulitis of right shoulder: Secondary | ICD-10-CM | POA: Diagnosis not present

## 2020-02-26 DIAGNOSIS — M7501 Adhesive capsulitis of right shoulder: Secondary | ICD-10-CM | POA: Diagnosis not present

## 2020-03-06 DIAGNOSIS — M7501 Adhesive capsulitis of right shoulder: Secondary | ICD-10-CM | POA: Diagnosis not present

## 2020-03-08 ENCOUNTER — Other Ambulatory Visit: Payer: Self-pay | Admitting: Endocrinology

## 2020-03-08 DIAGNOSIS — D352 Benign neoplasm of pituitary gland: Secondary | ICD-10-CM

## 2020-03-10 ENCOUNTER — Other Ambulatory Visit (INDEPENDENT_AMBULATORY_CARE_PROVIDER_SITE_OTHER): Payer: BC Managed Care – PPO

## 2020-03-10 ENCOUNTER — Other Ambulatory Visit: Payer: Self-pay

## 2020-03-10 DIAGNOSIS — N951 Menopausal and female climacteric states: Secondary | ICD-10-CM | POA: Diagnosis not present

## 2020-03-10 DIAGNOSIS — D352 Benign neoplasm of pituitary gland: Secondary | ICD-10-CM | POA: Diagnosis not present

## 2020-03-10 DIAGNOSIS — M7501 Adhesive capsulitis of right shoulder: Secondary | ICD-10-CM | POA: Diagnosis not present

## 2020-03-11 LAB — PROLACTIN: Prolactin: 7.9 ng/mL (ref 4.8–23.3)

## 2020-03-12 NOTE — Progress Notes (Signed)
Patient ID: Michele Ortiz, female   DOB: 07/01/1970, 50 y.o.   MRN: [id number]           Chief complaint:  Follow-up of prolactinoma  History of Present Illness:  The patient had been seen in 2013 when she was having progressive decline in her menstrual bleeding and eventually stopped having menstrual cycles.  At that time her prolactin level was 109 She did not follow-up or get her MRI scan because of lack of insurance When she saw her new gynecologist in 11/2014 she was still not having any menstrual cycles, no history of any milky discharge from the breasts.  She has not had any previous pregnancies except when she was around 20 and this was terminated  Baseline prolactin level was 72.5   MRI showed likely 7 mm microadenoma on the right in 2016   She was started on CABERGOLINE 0.5 mg, half tablet twice a week in 12/ 16 With this she has had regular menstrual cycles  Since her prolactin was down to 4.7 in 1/17 she was told to take medication once a week but she misunderstood and stopped it completely. This resulted in her prolactin going up back up to 72 She was then restarted on Dostinex  Previously in 6/18 she had not been taking her medication partly because of insurance problems Follow-up prolactin was 102  She had been prescribed DOSTINEX half tablet twice a week since 07/2017  However in 9/21 she had been out of her medication due to lack of follow-up and prescription availability She was having headaches which were new Also was not having menstrual cycles Cabergoline was restarted at 0.25 mg twice a week for prolactin level of 76  She has been taking Dostinex on Sunday mornings and this does not make her drowsy  Since her prolactin came down below normal since 11/2019 she has been taking half tablet Dostinex once a week now  She was starting to have hot flashes before her last visit and these are still present but less prominent She is still not having any  menstrual cycles Her headaches improved with restarting Dostinex in 2021  Prolactin level is now 7.9    Lab Results  Component Value Date   PROLACTIN 7.9 03/10/2020   PROLACTIN 1.6 (L) 12/19/2019   PROLACTIN 75.7 (H) 10/30/2019   PROLACTIN 11.8 11/20/2017   PROLACTIN 101.9 (H) 08/07/2017   PROLACTIN 106.6 (H) 08/16/2016   PROLACTIN 4.0 (L) 03/18/2016   PROLACTIN 9.7 09/18/2015        Past Medical History:  Diagnosis Date  . Hyperlipidemia    LDL 176 in 10/16, started atorvastatin    History reviewed. No pertinent surgical history.  Family History  Problem Relation Age of Onset  . Hyperlipidemia Mother   . Diabetes Maternal Grandmother     Social History:  reports that she has never smoked. She has never used smokeless tobacco. No history on file for alcohol use and drug use.  Allergies: No Known Allergies  Allergies as of 03/13/2020   No Known Allergies     Medication List       Accurate as of March 13, 2020  8:16 AM. If you have any questions, ask your nurse or doctor.        atorvastatin 20 MG tablet Commonly known as: LIPITOR Take 20 mg by mouth daily.   cabergoline 0.5 MG tablet Commonly known as: DOSTINEX Take 1/2 tablet once a week   OVER THE COUNTER MEDICATION Goli gummies  ZINC PO Take by mouth.       LABS:  Lab on 03/10/2020  Component Date Value Ref Range Status  . Prolactin 03/10/2020 7.9  4.8 - 23.3 ng/mL Final       Review of Systems  She is on atorvastatin from her PCP  PHYSICAL EXAM:  BP 130/82 (BP Location: Left Arm, Patient Position: Sitting, Cuff Size: Normal)   Pulse 84   Ht 5\' 7"  (1.702 m)   Wt 168 lb (76.2 kg)   SpO2 92%   BMI 26.31 kg/m    Exam not indicated  ASSESSMENT:   History of amenorrhea and hyperprolactinemia  from a 7 mm prolactinoma  She has been unable to get off her treatment with cabergoline because of recurrence of hyperprolactinemia in 2021 With taking 0.25 mg weekly her prolactin  level is back to normal However she is not having any menstrual cycles at this time but is also having hot flashes for the last few months   Pituitary tumor: Previously had 7 mm adenoma likely a prolactinoma    PLAN:    Continue 0.25 mg cabergoline weekly Check FSH since she may well be in menopause now  We will see her back in 6 months but potentially may be able to stop her cabergoline as a trial in about a year  Discussed that if she is a candidate for HRT she will have to take progesterone and estrogen both  [name] 03/13/2020, 8:16 AM

## 2020-03-13 ENCOUNTER — Encounter: Payer: Self-pay | Admitting: Endocrinology

## 2020-03-13 ENCOUNTER — Ambulatory Visit (INDEPENDENT_AMBULATORY_CARE_PROVIDER_SITE_OTHER): Payer: BC Managed Care – PPO | Admitting: Endocrinology

## 2020-03-13 ENCOUNTER — Other Ambulatory Visit: Payer: Self-pay

## 2020-03-13 VITALS — BP 130/82 | HR 84 | Ht 67.0 in | Wt 168.0 lb

## 2020-03-13 DIAGNOSIS — D352 Benign neoplasm of pituitary gland: Secondary | ICD-10-CM | POA: Diagnosis not present

## 2020-03-13 DIAGNOSIS — N951 Menopausal and female climacteric states: Secondary | ICD-10-CM

## 2020-03-13 DIAGNOSIS — M7501 Adhesive capsulitis of right shoulder: Secondary | ICD-10-CM | POA: Diagnosis not present

## 2020-03-18 LAB — FOLLICLE STIMULATING HORMONE: FSH: 36.7 m[IU]/mL

## 2020-03-18 LAB — SPECIMEN STATUS REPORT

## 2020-03-20 DIAGNOSIS — M7501 Adhesive capsulitis of right shoulder: Secondary | ICD-10-CM | POA: Diagnosis not present

## 2020-03-24 DIAGNOSIS — M7501 Adhesive capsulitis of right shoulder: Secondary | ICD-10-CM | POA: Diagnosis not present

## 2020-03-27 DIAGNOSIS — M7501 Adhesive capsulitis of right shoulder: Secondary | ICD-10-CM | POA: Diagnosis not present

## 2020-03-31 DIAGNOSIS — M7501 Adhesive capsulitis of right shoulder: Secondary | ICD-10-CM | POA: Diagnosis not present

## 2020-04-03 DIAGNOSIS — M7501 Adhesive capsulitis of right shoulder: Secondary | ICD-10-CM | POA: Diagnosis not present

## 2020-04-08 DIAGNOSIS — M7501 Adhesive capsulitis of right shoulder: Secondary | ICD-10-CM | POA: Diagnosis not present

## 2020-04-10 DIAGNOSIS — M7501 Adhesive capsulitis of right shoulder: Secondary | ICD-10-CM | POA: Diagnosis not present

## 2020-04-13 DIAGNOSIS — M7501 Adhesive capsulitis of right shoulder: Secondary | ICD-10-CM | POA: Diagnosis not present

## 2020-04-14 ENCOUNTER — Ambulatory Visit (INDEPENDENT_AMBULATORY_CARE_PROVIDER_SITE_OTHER): Payer: BC Managed Care – PPO | Admitting: Dermatology

## 2020-04-14 ENCOUNTER — Other Ambulatory Visit: Payer: Self-pay

## 2020-04-14 ENCOUNTER — Encounter: Payer: Self-pay | Admitting: Dermatology

## 2020-04-14 DIAGNOSIS — L821 Other seborrheic keratosis: Secondary | ICD-10-CM | POA: Diagnosis not present

## 2020-04-14 DIAGNOSIS — L918 Other hypertrophic disorders of the skin: Secondary | ICD-10-CM | POA: Diagnosis not present

## 2020-04-16 DIAGNOSIS — M7501 Adhesive capsulitis of right shoulder: Secondary | ICD-10-CM | POA: Diagnosis not present

## 2020-04-20 ENCOUNTER — Encounter: Payer: Self-pay | Admitting: Dermatology

## 2020-04-20 NOTE — Progress Notes (Signed)
   New Patient   Subjective  Michele Ortiz is a 50 y.o. female who presents for the following: New Patient (Initial Visit) (Patient here today for removal of skin tags around neck and both axilla x years. No bleeding.).  Skin tags neck and axilla.  Bumps on face. Location:  Duration:  Quality:  Associated Signs/Symptoms: Modifying Factors:  Severity:  Timing: Context:    The following portions of the chart were reviewed this encounter and updated as appropriate:  Tobacco  Allergies  Meds  Problems  Med Hx  Surg Hx  Fam Hx      Objective  Well appearing patient in no apparent distress; mood and affect are within normal limits. Objective  Left Malar Cheek, Right Malar Cheek: Cheekbones with a mixture of mostly 1 to 2 mm tiny keratotic dark brown papules that are keratoses (dermatosis papulosis) plus some pigmented skin tags.  Objective  Left Anterior Neck, Left Axilla, Right Anterior Neck, Right Axilla: Fleshy, skin-colored and brown pedunculated papules.   Waiver signed  Images      A focused examination was performed including Head, neck, axillae, upper torso.. Relevant physical exam findings are noted in the Assessment and Plan.   Assessment & Plan  Seborrheic keratosis (2) Left Malar Cheek; Right Malar Cheek  These are not the patient's current focus and no intervention initiated.  Skin tag (4) Left Axilla; Right Axilla; Left Anterior Neck; Right Anterior Neck    Epidermal / dermal shaving - Left Anterior Neck, Left Axilla, Right Anterior Neck, Right Axilla  Informed consent: discussed and consent obtained   Timeout: patient name, date of birth, surgical site, and procedure verified   Instrument used: scissors   Hemostasis achieved with: ferric subsulfate   Outcome: patient tolerated procedure well   Post-procedure details: sterile dressing applied   Dressing type: bandage and petrolatum

## 2020-04-25 NOTE — Progress Notes (Signed)
I, Lavonna Monarch, MD, have reviewed all documentation for this visit. The documentation on 04/25/20 for the exam, diagnosis, procedures, and orders are all accurate and complete.

## 2020-08-19 ENCOUNTER — Other Ambulatory Visit: Payer: Self-pay | Admitting: Endocrinology

## 2020-09-08 ENCOUNTER — Other Ambulatory Visit: Payer: Self-pay

## 2020-09-08 ENCOUNTER — Other Ambulatory Visit (INDEPENDENT_AMBULATORY_CARE_PROVIDER_SITE_OTHER): Payer: BC Managed Care – PPO

## 2020-09-08 DIAGNOSIS — D352 Benign neoplasm of pituitary gland: Secondary | ICD-10-CM | POA: Diagnosis not present

## 2020-09-08 DIAGNOSIS — N951 Menopausal and female climacteric states: Secondary | ICD-10-CM | POA: Diagnosis not present

## 2020-09-09 LAB — PROLACTIN: Prolactin: 12.6 ng/mL (ref 4.8–23.3)

## 2020-09-09 LAB — FOLLICLE STIMULATING HORMONE: FSH: 35.4 m[IU]/mL

## 2020-09-10 NOTE — Progress Notes (Signed)
Patient ID: Michele Ortiz, female   DOB: Aug 24, 1970, 50 y.o.   MRN: [id number]           Chief complaint:  Follow-up of prolactinoma  History of Present Illness:  The patient had been seen in 2013 when she was having progressive decline in her menstrual bleeding and eventually stopped having menstrual cycles.  At that time her prolactin level was 109 She did not follow-up or get her MRI scan because of lack of insurance When she saw her new gynecologist in 11/2014 she was still not having any menstrual cycles, no history of any milky discharge from the breasts.  She has not had any previous pregnancies except when she was around 20 and this was terminated  Baseline prolactin level was 72.5   MRI showed likely 7 mm microadenoma on the right in 2016   She was started on CABERGOLINE 0.5 mg, half tablet twice a week in 12/ 16 With this she has had regular menstrual cycles  Since her prolactin was down to 4.7 in 1/17 she was told to take medication once a week but she misunderstood and stopped it completely. This resulted in her prolactin going up back up to 72 She was then restarted on Dostinex  Previously in 6/18 she had not been taking her medication partly because of insurance problems Follow-up prolactin was 102  In 9/21 she had been out of her medication due to lack of follow-up and prescription availability She was having headaches which were new Also was not having menstrual cycles Cabergoline was restarted at 0.25 mg twice a week for prolactin level of 76  Recent history: She has been taking Dostinex on Sunday mornings and this does not make her drowsy  Since her prolactin came down below normal since 11/2019 she has been taking half tablet Dostinex once a week   She was starting to have hot flashes in 2021 and these are diminishing She is still not having any menstrual cycles Her headaches improved with restarting Dostinex in 2021  Prolactin level is now 12.6  compared to 7.9    Lab Results  Component Value Date   PROLACTIN 12.6 09/08/2020   PROLACTIN 7.9 03/10/2020   PROLACTIN 1.6 (L) 12/19/2019   PROLACTIN 75.7 (H) 10/30/2019   PROLACTIN 11.8 11/20/2017   PROLACTIN 101.9 (H) 08/07/2017   PROLACTIN 106.6 (H) 08/16/2016   PROLACTIN 4.0 (L) 03/18/2016        Past Medical History:  Diagnosis Date   Hyperlipidemia    LDL 176 in 10/16, started atorvastatin    No past surgical history on file.  Family History  Problem Relation Age of Onset   Hyperlipidemia Mother    Diabetes Maternal Grandmother     Social History:  reports that she has never smoked. She has never used smokeless tobacco. She reports that she does not drink alcohol and does not use drugs.  Allergies: No Known Allergies  Allergies as of 09/11/2020   No Known Allergies      Medication List        Accurate as of September 11, 2020  8:35 AM. If you have any questions, ask your nurse or doctor.          atorvastatin 20 MG tablet Commonly known as: LIPITOR Take 20 mg by mouth daily.   cabergoline 0.5 MG tablet Commonly known as: DOSTINEX TAKE HALF TABLET BY MOUTH TWICE A WEEK   OVER THE COUNTER MEDICATION Goli gummies   ZINC PO Take by mouth.  LABS:  Lab on 09/08/2020  Component Date Value Ref Range Status   Prolactin 09/08/2020 12.6  4.8 - 23.3 ng/mL Final   [facility] 09/08/2020 35.4  mIU/mL Final   Comment:                     Adult Female:                       Follicular phase      3.5 -  12.5                       Ovulation phase       4.7 -  21.5                       Luteal phase          1.7 -   7.7                       Postmenopausal       25.8 - 134.8        Review of Systems  She is on atorvastatin from her PCP  PHYSICAL EXAM:  BP 118/82   Pulse 71   Ht 5\' 7"  (1.702 m)   Wt 170 lb (77.1 kg)   SpO2 99%   BMI 26.63 kg/m    Exam not indicated  ASSESSMENT:   History of amenorrhea and hyperprolactinemia  from a 7 mm  prolactinoma  With taking 0.25 mg weekly her prolactin level is consistently back to normal However she is not having any menstrual cycles at this time likely from being postmenopausal  Hot flashes are diminishing and [place] indicates early menopause   Pituitary tumor: Previously had 7 mm adenoma likely a prolactinoma    PLAN:    Since she is likely in menopause we will give her a trial of stopping the cabergoline Will recheck prolactin and follow-up in 3 months    Amire Leazer 09/11/2020, 8:35 AM

## 2020-09-11 ENCOUNTER — Ambulatory Visit (INDEPENDENT_AMBULATORY_CARE_PROVIDER_SITE_OTHER): Payer: BC Managed Care – PPO | Admitting: Endocrinology

## 2020-09-11 ENCOUNTER — Encounter: Payer: Self-pay | Admitting: Endocrinology

## 2020-09-11 ENCOUNTER — Other Ambulatory Visit: Payer: Self-pay

## 2020-09-11 VITALS — BP 118/82 | HR 71 | Ht 67.0 in | Wt 170.0 lb

## 2020-09-11 DIAGNOSIS — D352 Benign neoplasm of pituitary gland: Secondary | ICD-10-CM | POA: Diagnosis not present

## 2020-09-11 NOTE — Patient Instructions (Signed)
Stop Cabergoline

## 2020-11-17 DIAGNOSIS — R7303 Prediabetes: Secondary | ICD-10-CM | POA: Diagnosis not present

## 2020-11-17 DIAGNOSIS — E785 Hyperlipidemia, unspecified: Secondary | ICD-10-CM | POA: Diagnosis not present

## 2020-11-17 DIAGNOSIS — Z0000 Encounter for general adult medical examination without abnormal findings: Secondary | ICD-10-CM | POA: Diagnosis not present

## 2020-11-17 DIAGNOSIS — E559 Vitamin D deficiency, unspecified: Secondary | ICD-10-CM | POA: Diagnosis not present

## 2020-11-27 DIAGNOSIS — Z0183 Encounter for blood typing: Secondary | ICD-10-CM | POA: Diagnosis not present

## 2020-12-01 ENCOUNTER — Other Ambulatory Visit: Payer: Self-pay

## 2020-12-01 ENCOUNTER — Other Ambulatory Visit (INDEPENDENT_AMBULATORY_CARE_PROVIDER_SITE_OTHER): Payer: BC Managed Care – PPO

## 2020-12-01 DIAGNOSIS — D352 Benign neoplasm of pituitary gland: Secondary | ICD-10-CM

## 2020-12-02 LAB — PROLACTIN: Prolactin: 14.8 ng/mL (ref 4.8–23.3)

## 2020-12-04 ENCOUNTER — Other Ambulatory Visit: Payer: Self-pay

## 2020-12-04 ENCOUNTER — Ambulatory Visit (INDEPENDENT_AMBULATORY_CARE_PROVIDER_SITE_OTHER): Payer: BC Managed Care – PPO | Admitting: Endocrinology

## 2020-12-04 VITALS — BP 132/78 | HR 73 | Ht 67.0 in | Wt 172.8 lb

## 2020-12-04 DIAGNOSIS — D352 Benign neoplasm of pituitary gland: Secondary | ICD-10-CM | POA: Diagnosis not present

## 2020-12-04 NOTE — Progress Notes (Signed)
Patient ID: Michele Ortiz, female   DOB: [birth year], 50 y.o.   MRN: [id number]           Chief complaint:  Follow-up of prolactinoma  History of Present Illness:  The patient had been seen in 2013 when she was having progressive decline in her menstrual bleeding and eventually stopped having menstrual cycles.  At that time her prolactin level was 109 She did not follow-up or get her MRI scan because of lack of insurance When she saw her new gynecologist in 11/2014 she was still not having any menstrual cycles, no history of any milky discharge from the breasts.  She has not had any previous pregnancies except when she was around 20 and this was terminated  Baseline prolactin level was 72.5   MRI showed likely 7 mm microadenoma on the right in 2016   She was started on CABERGOLINE 0.5 mg, half tablet twice a week in 12/ 16 With this she has had regular menstrual cycles  Since her prolactin was down to 4.7 in 1/17 she was told to take medication once a week but she misunderstood and stopped it completely. This resulted in her prolactin going up back up to 72 She was then restarted on Dostinex  Previously in 6/18 she had not been taking her medication partly because of insurance problems Follow-up prolactin was 102  In 9/21 she had been out of her medication due to lack of follow-up and prescription availability She was having headaches which were new Also was not having menstrual cycles Cabergoline was restarted at 0.25 mg twice a week for prolactin level of 76 Her headaches improved with restarting Dostinex in 2021  Recent history:  Since her prolactin came down back to normal since 11/2019 after restarting half tablet Dostinex once a week   She was starting to have hot flashes in 2021  She is still not having any menstrual cycles  Since she was felt to be in menopause her cabergoline was stopped on 7/22 She does not feel any different with this although she does still  have problems with hot flashes at night  Prolactin level is now 14.8 vs 12.6   Lab Results  Component Value Date   PROLACTIN 14.8 12/01/2020   PROLACTIN 12.6 09/08/2020   PROLACTIN 7.9 03/10/2020   PROLACTIN 1.6 (L) 12/19/2019   PROLACTIN 75.7 (H) 10/30/2019   PROLACTIN 11.8 11/20/2017   PROLACTIN 101.9 (H) 08/07/2017   PROLACTIN 106.6 (H) 08/16/2016        Past Medical History:  Diagnosis Date   Hyperlipidemia    LDL 176 in 10/16, started atorvastatin    No past surgical history on file.  Family History  Problem Relation Age of Onset   Hyperlipidemia Mother    Diabetes Maternal Grandmother     Social History:  reports that she has never smoked. She has never used smokeless tobacco. She reports that she does not drink alcohol and does not use drugs.  Allergies: No Known Allergies  Allergies as of 12/04/2020   No Known Allergies      Medication List        Accurate as of December 04, 2020  8:25 AM. If you have any questions, ask your nurse or doctor.          atorvastatin 20 MG tablet Commonly known as: LIPITOR Take 20 mg by mouth daily.   cabergoline 0.5 MG tablet Commonly known as: DOSTINEX TAKE HALF TABLET BY MOUTH TWICE A WEEK   OVER  THE COUNTER MEDICATION Goli gummies   ZINC PO Take by mouth.        LABS:  Lab on 12/01/2020  Component Date Value Ref Range Status   Prolactin 12/01/2020 14.8  4.8 - 23.3 ng/mL Final       Review of Systems  She is on atorvastatin from her PCP  PHYSICAL EXAM:  BP 132/78   Pulse 73   Ht 5\' 7"  (1.702 m)   Wt 172 lb 12.8 oz (78.4 kg)   SpO2 99%   BMI 27.06 kg/m    Exam not indicated  ASSESSMENT:   History of amenorrhea and hyperprolactinemia  from a 7 mm prolactinoma  Previously taking 0.25 mg weekly with good control of her prolactin level  Now with being in menopause her prolactin is staying normal with stopping her cabergoline nearly 3 months ago  Still having some issues with hot  flashes and is planning to start black cohosh as per her PCP    PLAN:    Continue to leave off cabergoline She will be also talking to her gynecologist regarding postmenopausal symptoms and may possibly benefit from short-term HRT  Follow-up prolactin in 4 months to ensure stability    [name] 12/04/2020, 8:25 AM

## 2021-04-13 ENCOUNTER — Other Ambulatory Visit (INDEPENDENT_AMBULATORY_CARE_PROVIDER_SITE_OTHER): Payer: BC Managed Care – PPO

## 2021-04-13 ENCOUNTER — Other Ambulatory Visit: Payer: Self-pay

## 2021-04-13 DIAGNOSIS — D352 Benign neoplasm of pituitary gland: Secondary | ICD-10-CM | POA: Diagnosis not present

## 2021-04-14 LAB — PROLACTIN: Prolactin: 22 ng/mL (ref 4.8–23.3)

## 2021-04-16 ENCOUNTER — Ambulatory Visit (INDEPENDENT_AMBULATORY_CARE_PROVIDER_SITE_OTHER): Payer: BC Managed Care – PPO | Admitting: Endocrinology

## 2021-04-16 ENCOUNTER — Encounter: Payer: Self-pay | Admitting: Endocrinology

## 2021-04-16 ENCOUNTER — Other Ambulatory Visit: Payer: Self-pay

## 2021-04-16 VITALS — BP 124/82 | HR 98 | Ht 67.0 in | Wt 178.2 lb

## 2021-04-16 DIAGNOSIS — D352 Benign neoplasm of pituitary gland: Secondary | ICD-10-CM | POA: Diagnosis not present

## 2021-04-16 NOTE — Progress Notes (Signed)
Patient ID: Michele Ortiz, female   DOB: 01/26/1971, 51 y.o.   MRN: [id number]           Chief complaint:  Follow-up of prolactinoma  History of Present Illness:  The patient had been seen in 2013 when she was having progressive decline in her menstrual bleeding and eventually stopped having menstrual cycles.  At that time her prolactin level was 109 She did not follow-up or get her MRI scan because of lack of insurance When she saw her new gynecologist in 11/2014 she was still not having any menstrual cycles, no history of any milky discharge from the breasts.  She has not had any previous pregnancies except when she was around 20 and this was terminated  Baseline prolactin level was 72.5   MRI showed likely 7 mm microadenoma on the right in 2016   She was started on CABERGOLINE 0.5 mg, half tablet twice a week in 12/ 16 With this she has had regular menstrual cycles  Since her prolactin was down to 4.7 in 1/17 she was told to take medication once a week but she misunderstood and stopped it completely. This resulted in her prolactin going up back up to 72 She was then restarted on Dostinex  Previously in 6/18 she had not been taking her medication partly because of insurance problems Follow-up prolactin was 102  In 9/21 she had been out of her medication due to lack of follow-up and prescription availability She was having headaches which were new Also was not having menstrual cycles Cabergoline was restarted at 0.25 mg twice a week for prolactin level of 76 Her headaches improved with restarting Dostinex in 2021  Recent history:  She was starting to have hot flashes in 2021 and has been postmenopausal  Since she was felt to be in menopause her cabergoline was stopped in 08/2020 She does not have any headaches, no galactorrhea Hot flashes are improving  Prolactin is upper normal at 22   Lab Results  Component Value Date   PROLACTIN 22.0 04/13/2021   PROLACTIN 14.8  12/01/2020   PROLACTIN 12.6 09/08/2020   PROLACTIN 7.9 03/10/2020   PROLACTIN 1.6 (L) 12/19/2019   PROLACTIN 75.7 (H) 10/30/2019   PROLACTIN 11.8 11/20/2017   PROLACTIN 101.9 (H) 08/07/2017        Past Medical History:  Diagnosis Date   Hyperlipidemia    LDL 176 in 10/16, started atorvastatin    No past surgical history on file.  Family History  Problem Relation Age of Onset   Hyperlipidemia Mother    Diabetes Maternal Grandmother     Social History:  reports that she has never smoked. She has never used smokeless tobacco. She reports that she does not drink alcohol and does not use drugs.  Allergies: No Known Allergies  Allergies as of 04/16/2021   No Known Allergies      Medication List        Accurate as of April 16, 2021  8:26 AM. If you have any questions, ask your nurse or doctor.          atorvastatin 20 MG tablet Commonly known as: LIPITOR Take 20 mg by mouth daily.   OVER THE COUNTER MEDICATION Goli gummies   ZINC PO Take by mouth.        LABS:  Lab on 04/13/2021  Component Date Value Ref Range Status   Prolactin 04/13/2021 22.0  4.8 - 23.3 ng/mL Final       Review of Systems  She is on atorvastatin from her PCP  PHYSICAL EXAM:  BP 124/82    Pulse 98    Ht 5\' 7"  (1.702 m)    Wt 178 lb 3.2 oz (80.8 kg)    SpO2 99%    BMI 27.91 kg/m    Exam not indicated  ASSESSMENT:   History of 7 mm prolactinoma with baseline prolactin of 109 diagnosed in 2013  Previously taking 0.25 mg weekly with good control of her prolactin level   She is off treatment since she became postmenopausal and has not had any cabergoline since 08/2020  Although prolactin is upper normal it is still not rising excessively as it did when she was premenopausal and is asymptomatic    PLAN:    She can likely stay off cabergoline indefinitely now  However should have a prolactin level measured about once a year for the next couple of years She can have  this done through her gynecologist or PCP at her regular visits Follow-up as needed    [name] 04/16/2021, 8:26 AM

## 2021-04-30 DIAGNOSIS — M65312 Trigger thumb, left thumb: Secondary | ICD-10-CM | POA: Diagnosis not present

## 2021-04-30 DIAGNOSIS — M79645 Pain in left finger(s): Secondary | ICD-10-CM | POA: Diagnosis not present

## 2021-12-23 DIAGNOSIS — Z23 Encounter for immunization: Secondary | ICD-10-CM | POA: Diagnosis not present

## 2021-12-23 DIAGNOSIS — H6122 Impacted cerumen, left ear: Secondary | ICD-10-CM | POA: Diagnosis not present

## 2021-12-23 DIAGNOSIS — E559 Vitamin D deficiency, unspecified: Secondary | ICD-10-CM | POA: Diagnosis not present

## 2021-12-23 DIAGNOSIS — Z0000 Encounter for general adult medical examination without abnormal findings: Secondary | ICD-10-CM | POA: Diagnosis not present

## 2021-12-23 DIAGNOSIS — E785 Hyperlipidemia, unspecified: Secondary | ICD-10-CM | POA: Diagnosis not present

## 2021-12-23 DIAGNOSIS — R7303 Prediabetes: Secondary | ICD-10-CM | POA: Diagnosis not present

## 2022-12-09 ENCOUNTER — Encounter (HOSPITAL_COMMUNITY): Payer: Self-pay | Admitting: *Deleted

## 2022-12-09 ENCOUNTER — Ambulatory Visit (HOSPITAL_COMMUNITY)
Admission: EM | Admit: 2022-12-09 | Discharge: 2022-12-09 | Disposition: A | Discharge status: Home / Self Care | Payer: BC Managed Care – PPO | Attending: Internal Medicine | Admitting: Internal Medicine

## 2022-12-09 ENCOUNTER — Other Ambulatory Visit: Payer: Self-pay

## 2022-12-09 DIAGNOSIS — T63441A Toxic effect of venom of bees, accidental (unintentional), initial encounter: Secondary | ICD-10-CM

## 2022-12-09 NOTE — ED Provider Notes (Signed)
[facility]    CSN: [id number] Arrival date & time: 12/09/22  1719      History   Chief Complaint Chief Complaint  Patient presents with   Insect Bite    HPI Michele Ortiz is a 52 y.o. female.   The history is provided by the patient.   Stung by bee right forearm yesterday at work.  Treated it with ice application, topical Caladryl, oral loratadine x 2 doses.  Admits itching, redness, swelling, heat which has increased today. Denies fever, coughing, wheezing, shortness of breath, history of prior adverse reaction to a bee sting, red streak.   Past Medical History:  Diagnosis Date   Hyperlipidemia    LDL 176 in 10/16, started atorvastatin    Patient Active Problem List   Diagnosis Date Noted   Prolactinoma, benign (HCC) 06/26/2015   Hyperprolactinemia (HCC) 01/13/2015    History reviewed. No pertinent surgical history.  OB History   No obstetric history on file.      Home Medications    Prior to Admission medications   Medication Sig Start Date End Date Taking? Authorizing Provider  atorvastatin (LIPITOR) 20 MG tablet Take 20 mg by mouth daily. 03/10/20  Yes [provider]  Multiple Vitamins-Minerals (ZINC PO) Take by mouth.   Yes [provider]  OVER THE COUNTER MEDICATION Goli gummies    [provider]    Family History Family History  Problem Relation Age of Onset   Hyperlipidemia Mother    Diabetes Maternal Grandmother     Social History Social History   Tobacco Use   Smoking status: Never   Smokeless tobacco: Never  Substance Use Topics   Alcohol use: Never    Alcohol/week: 0.0 standard drinks of alcohol   Drug use: Never     Allergies   Patient has no known allergies.   Review of Systems Review of Systems  Constitutional:  Negative for chills and fever.  Respiratory:  Negative for cough, shortness of breath and wheezing.   Skin:  Positive for color change.     Physical Exam Triage  Vital Signs ED Triage Vitals  Encounter Vitals Group     BP 12/09/22 1734 (!) 160/104     Systolic BP Percentile --      Diastolic BP Percentile --      Pulse Rate 12/09/22 1734 85     Resp 12/09/22 1734 16     Temp 12/09/22 1734 98.1 F (36.7 C)     Temp src --      SpO2 12/09/22 1734 97 %     Weight --      Height --      Head Circumference --      Peak Flow --      Pain Score 12/09/22 1731 0     Pain Loc --      Pain Education --      Exclude from Growth Chart --    No data found.  Updated Vital Signs BP (!) 160/104   Pulse 85   Temp 98.1 F (36.7 C)   Resp 16   LMP 03/16/2016   SpO2 97%   Visual Acuity Right Eye Distance:   Left Eye Distance:   Bilateral Distance:    Right Eye Near:   Left Eye Near:    Bilateral Near:     Physical Exam Vitals and nursing note reviewed.  HENT:     Head: Normocephalic.  Skin:    Findings: Erythema  present.     Comments: Approximately 12 x 14 cm area of erythema, warmth and induration right forearm medial aspect, no red streaks, wrist and elbow with full range of motion. Good pulse  Neurological:     Mental Status: Michele Ortiz is alert.      UC Treatments / Results  Labs (all labs ordered are listed, but only abnormal results are displayed) Labs Reviewed - No data to display  EKG   Radiology No results found.  Procedures Procedures (including critical care time)  Medications Ordered in UC Medications - No data to display  Initial Impression / Assessment and Plan / UC Course  I have reviewed the triage vital signs and the nursing notes.  Pertinent labs & imaging results that were available during my care of the patient were reviewed by me and considered in my medical decision making (see chart for details).     27-year-old female with bee sting yesterday having increased swelling and itching today.  Admits redness and warmth.  Denies fever.  Exam has localized induration redness and warmth without red streaks, Michele Ortiz  is afebrile.  Good range of motion in the extremity.  Discussed with patient less allergic reaction continue the antihistamine, apply hydrocortisone cream, continue ice packs and elevation.  Warning signs and follow-up reviewed with patient Final Clinical Impressions(s) / UC Diagnoses   Final diagnoses:  None   Discharge Instructions   None    ED Prescriptions   None    PDMP not reviewed this encounter.   Meliton Rattan, [place] 12/09/22 [id number]

## 2022-12-09 NOTE — ED Triage Notes (Signed)
Pt was stung by bee yesterday and has swelling to RT forearm.

## 2022-12-09 NOTE — Discharge Instructions (Signed)
Continue the loratadine, you can increase it to twice a day or take 1 in the morning and Benadryl at bedtime.  Continue elevation and ice application, can use over-the-counter hydrocortisone cream twice daily to affected area.  Seek medical attention for development of fever, significant worsening of symptoms, wheezing, shortness of breath or concerns

## 2022-12-29 DIAGNOSIS — E785 Hyperlipidemia, unspecified: Secondary | ICD-10-CM | POA: Diagnosis not present

## 2022-12-29 DIAGNOSIS — Z0000 Encounter for general adult medical examination without abnormal findings: Secondary | ICD-10-CM | POA: Diagnosis not present

## 2022-12-29 DIAGNOSIS — R739 Hyperglycemia, unspecified: Secondary | ICD-10-CM | POA: Diagnosis not present

## 2022-12-29 DIAGNOSIS — E559 Vitamin D deficiency, unspecified: Secondary | ICD-10-CM | POA: Diagnosis not present

## 2023-08-02 DIAGNOSIS — D234 Other benign neoplasm of skin of scalp and neck: Secondary | ICD-10-CM | POA: Diagnosis not present

## 2023-08-02 DIAGNOSIS — D485 Neoplasm of uncertain behavior of skin: Secondary | ICD-10-CM | POA: Diagnosis not present

## 2023-12-21 DIAGNOSIS — D234 Other benign neoplasm of skin of scalp and neck: Secondary | ICD-10-CM | POA: Diagnosis not present

## 2023-12-21 DIAGNOSIS — R208 Other disturbances of skin sensation: Secondary | ICD-10-CM | POA: Diagnosis not present

## 2024-01-19 DIAGNOSIS — Z0000 Encounter for general adult medical examination without abnormal findings: Secondary | ICD-10-CM | POA: Diagnosis not present

## 2024-01-19 DIAGNOSIS — E559 Vitamin D deficiency, unspecified: Secondary | ICD-10-CM | POA: Diagnosis not present

## 2024-01-19 DIAGNOSIS — E229 Hyperfunction of pituitary gland, unspecified: Secondary | ICD-10-CM | POA: Diagnosis not present

## 2024-01-19 DIAGNOSIS — R739 Hyperglycemia, unspecified: Secondary | ICD-10-CM | POA: Diagnosis not present
